# Patient Record
Sex: Male | Born: 1965 | Race: White | Hispanic: No | Marital: Single | Smoking: Former smoker
Health system: Southern US, Community
[De-identification: ages and names within clinical notes are randomized; demographics above are authoritative.]

## PROBLEM LIST (undated history)

## (undated) DIAGNOSIS — N189 Chronic kidney disease, unspecified: Secondary | ICD-10-CM

## (undated) DIAGNOSIS — T7840XA Allergy, unspecified, initial encounter: Secondary | ICD-10-CM

## (undated) DIAGNOSIS — J939 Pneumothorax, unspecified: Secondary | ICD-10-CM

## (undated) DIAGNOSIS — C189 Malignant neoplasm of colon, unspecified: Secondary | ICD-10-CM

## (undated) DIAGNOSIS — B019 Varicella without complication: Secondary | ICD-10-CM

## (undated) DIAGNOSIS — B1910 Unspecified viral hepatitis B without hepatic coma: Secondary | ICD-10-CM

## (undated) DIAGNOSIS — Z800 Family history of malignant neoplasm of digestive organs: Secondary | ICD-10-CM

## (undated) DIAGNOSIS — I10 Essential (primary) hypertension: Secondary | ICD-10-CM

## (undated) DIAGNOSIS — I889 Nonspecific lymphadenitis, unspecified: Secondary | ICD-10-CM

## (undated) DIAGNOSIS — Z8042 Family history of malignant neoplasm of prostate: Secondary | ICD-10-CM

## (undated) DIAGNOSIS — K219 Gastro-esophageal reflux disease without esophagitis: Secondary | ICD-10-CM

## (undated) HISTORY — DX: Allergy, unspecified, initial encounter: T78.40XA

## (undated) HISTORY — DX: Pneumothorax, unspecified: J93.9

## (undated) HISTORY — PX: COLONOSCOPY: SHX174

## (undated) HISTORY — DX: Chronic kidney disease, unspecified: N18.9

## (undated) HISTORY — PX: COLON SURGERY: SHX602

## (undated) HISTORY — DX: Varicella without complication: B01.9

## (undated) HISTORY — DX: Nonspecific lymphadenitis, unspecified: I88.9

## (undated) HISTORY — DX: Family history of malignant neoplasm of digestive organs: Z80.0

## (undated) HISTORY — DX: Gastro-esophageal reflux disease without esophagitis: K21.9

## (undated) HISTORY — DX: Essential (primary) hypertension: I10

## (undated) HISTORY — DX: Family history of malignant neoplasm of prostate: Z80.42

## (undated) HISTORY — PX: PARTIAL COLECTOMY: SHX5273

## (undated) HISTORY — DX: Malignant neoplasm of colon, unspecified: C18.9

## (undated) HISTORY — DX: Unspecified viral hepatitis B without hepatic coma: B19.10

## (undated) HISTORY — PX: POLYPECTOMY: SHX149

---

## 2001-06-29 ENCOUNTER — Ambulatory Visit (HOSPITAL_COMMUNITY): Admission: EM | Admit: 2001-06-29 | Discharge: 2001-06-29 | Payer: Self-pay | Admitting: Emergency Medicine

## 2002-03-30 ENCOUNTER — Emergency Department (HOSPITAL_COMMUNITY): Admission: EM | Admit: 2002-03-30 | Discharge: 2002-03-30 | Payer: Self-pay | Admitting: Emergency Medicine

## 2006-09-10 ENCOUNTER — Ambulatory Visit: Payer: Self-pay | Admitting: Family Medicine

## 2006-11-23 HISTORY — PX: COLON SURGERY: SHX602

## 2006-12-03 ENCOUNTER — Ambulatory Visit: Payer: Self-pay | Admitting: Family Medicine

## 2006-12-03 ENCOUNTER — Ambulatory Visit: Payer: Self-pay | Admitting: Internal Medicine

## 2006-12-03 LAB — CONVERTED CEMR LAB
ALT: 23 units/L (ref 0–40)
AST: 20 units/L (ref 0–37)
Alkaline Phosphatase: 49 units/L (ref 39–117)
BUN: 13 mg/dL (ref 6–23)
Basophils Absolute: 0 10*3/uL (ref 0.0–0.1)
Basophils Relative: 0.8 % (ref 0.0–1.0)
Cholesterol: 161 mg/dL (ref 0–200)
GFR calc non Af Amer: 71 mL/min
Glomerular Filtration Rate, Af Am: 86 mL/min/{1.73_m2}
Glucose, Bld: 93 mg/dL (ref 70–99)
LDL Cholesterol: 111 mg/dL — ABNORMAL HIGH (ref 0–99)
MCHC: 33 g/dL (ref 30.0–36.0)
MCV: 89.2 fL (ref 78.0–100.0)
Platelets: 231 10*3/uL (ref 150–400)
Potassium: 4.1 meq/L (ref 3.5–5.1)
Sodium: 143 meq/L (ref 135–145)
VLDL: 15 mg/dL (ref 0–40)

## 2006-12-10 ENCOUNTER — Ambulatory Visit: Payer: Self-pay | Admitting: Family Medicine

## 2006-12-28 ENCOUNTER — Ambulatory Visit: Payer: Self-pay | Admitting: Gastroenterology

## 2007-01-11 ENCOUNTER — Encounter (INDEPENDENT_AMBULATORY_CARE_PROVIDER_SITE_OTHER): Payer: Self-pay | Admitting: Specialist

## 2007-01-11 ENCOUNTER — Encounter: Payer: Self-pay | Admitting: Family Medicine

## 2007-01-11 ENCOUNTER — Ambulatory Visit: Payer: Self-pay | Admitting: Gastroenterology

## 2007-01-18 ENCOUNTER — Ambulatory Visit: Payer: Self-pay | Admitting: Internal Medicine

## 2007-02-17 ENCOUNTER — Encounter (INDEPENDENT_AMBULATORY_CARE_PROVIDER_SITE_OTHER): Payer: Self-pay | Admitting: Specialist

## 2007-02-17 ENCOUNTER — Inpatient Hospital Stay (HOSPITAL_COMMUNITY): Admission: RE | Admit: 2007-02-17 | Discharge: 2007-02-23 | Payer: Self-pay | Admitting: General Surgery

## 2007-03-04 ENCOUNTER — Ambulatory Visit: Payer: Self-pay | Admitting: Oncology

## 2007-03-22 LAB — CBC WITH DIFFERENTIAL/PLATELET
HCT: 33 % — ABNORMAL LOW (ref 38.7–49.9)
HGB: 11.4 g/dL — ABNORMAL LOW (ref 13.0–17.1)
LYMPH%: 36.1 % (ref 14.0–48.0)
MCH: 28.5 pg (ref 28.0–33.4)
MONO%: 7.9 % (ref 0.0–13.0)
NEUT#: 2.9 10*3/uL (ref 1.5–6.5)
RBC: 4.02 10*6/uL — ABNORMAL LOW (ref 4.20–5.71)
RDW: 13.8 % (ref 11.2–14.6)

## 2007-03-22 LAB — COMPREHENSIVE METABOLIC PANEL
ALT: 18 U/L (ref 0–53)
Alkaline Phosphatase: 55 U/L (ref 39–117)
CO2: 26 mEq/L (ref 19–32)
Chloride: 101 mEq/L (ref 96–112)
Creatinine, Ser: 1.14 mg/dL (ref 0.40–1.50)
Glucose, Bld: 76 mg/dL (ref 70–99)
Potassium: 4.2 mEq/L (ref 3.5–5.3)
Sodium: 137 mEq/L (ref 135–145)
Total Protein: 7.1 g/dL (ref 6.0–8.3)

## 2007-03-24 LAB — PSA: PSA: 1.91 ng/mL (ref 0.10–4.00)

## 2007-03-28 ENCOUNTER — Ambulatory Visit (HOSPITAL_COMMUNITY): Admission: RE | Admit: 2007-03-28 | Discharge: 2007-03-28 | Payer: Self-pay | Admitting: General Surgery

## 2007-04-01 ENCOUNTER — Ambulatory Visit (HOSPITAL_COMMUNITY): Admission: RE | Admit: 2007-04-01 | Discharge: 2007-04-01 | Payer: Self-pay | Admitting: Oncology

## 2007-04-04 ENCOUNTER — Ambulatory Visit: Admission: RE | Admit: 2007-04-04 | Discharge: 2007-06-08 | Payer: Self-pay | Admitting: Radiation Oncology

## 2007-04-15 ENCOUNTER — Ambulatory Visit: Payer: Self-pay | Admitting: Oncology

## 2007-04-20 LAB — CBC WITH DIFFERENTIAL/PLATELET
EOS%: 6.4 % (ref 0.0–7.0)
Eosinophils Absolute: 0.3 10*3/uL (ref 0.0–0.5)
MCV: 80.7 fL — ABNORMAL LOW (ref 81.6–98.0)
MONO%: 7.7 % (ref 0.0–13.0)
NEUT#: 3.1 10*3/uL (ref 1.5–6.5)
RBC: 4.22 10*6/uL (ref 4.20–5.71)
RDW: 14 % (ref 11.2–14.6)
lymph#: 1.2 10*3/uL (ref 0.9–3.3)

## 2007-04-25 LAB — CBC WITH DIFFERENTIAL/PLATELET
Eosinophils Absolute: 0.3 10*3/uL (ref 0.0–0.5)
MONO#: 0.5 10*3/uL (ref 0.1–0.9)
NEUT#: 3.1 10*3/uL (ref 1.5–6.5)
Platelets: 225 10*3/uL (ref 145–400)
RBC: 4.55 10*6/uL (ref 4.20–5.71)
RDW: 13.9 % (ref 11.2–14.6)
WBC: 4.9 10*3/uL (ref 4.0–10.0)

## 2007-05-02 LAB — CBC WITH DIFFERENTIAL/PLATELET
Eosinophils Absolute: 0.3 10*3/uL (ref 0.0–0.5)
HCT: 32.8 % — ABNORMAL LOW (ref 38.7–49.9)
HGB: 11.5 g/dL — ABNORMAL LOW (ref 13.0–17.1)
LYMPH%: 16.5 % (ref 14.0–48.0)
MONO#: 0.4 10*3/uL (ref 0.1–0.9)
NEUT#: 2.4 10*3/uL (ref 1.5–6.5)
NEUT%: 65.5 % (ref 40.0–75.0)
Platelets: 180 10*3/uL (ref 145–400)
WBC: 3.7 10*3/uL — ABNORMAL LOW (ref 4.0–10.0)

## 2007-05-09 LAB — CBC WITH DIFFERENTIAL/PLATELET
BASO%: 0.3 % (ref 0.0–2.0)
HCT: 36.7 % — ABNORMAL LOW (ref 38.7–49.9)
LYMPH%: 10.3 % — ABNORMAL LOW (ref 14.0–48.0)
MCH: 28.2 pg (ref 28.0–33.4)
MCHC: 34.5 g/dL (ref 32.0–35.9)
MONO#: 0.5 10*3/uL (ref 0.1–0.9)
NEUT%: 69.9 % (ref 40.0–75.0)
Platelets: 239 10*3/uL (ref 145–400)
WBC: 3.8 10*3/uL — ABNORMAL LOW (ref 4.0–10.0)

## 2007-05-12 LAB — COMPREHENSIVE METABOLIC PANEL
ALT: 12 U/L (ref 0–53)
BUN: 14 mg/dL (ref 6–23)
CO2: 26 mEq/L (ref 19–32)
Creatinine, Ser: 1.34 mg/dL (ref 0.40–1.50)
Total Bilirubin: 0.3 mg/dL (ref 0.3–1.2)

## 2007-05-16 LAB — URINALYSIS, MICROSCOPIC - CHCC
Glucose: NEGATIVE g/dL
Leukocyte Esterase: NEGATIVE
Nitrite: NEGATIVE

## 2007-05-16 LAB — BASIC METABOLIC PANEL
BUN: 9 mg/dL (ref 6–23)
Chloride: 107 mEq/L (ref 96–112)
Creatinine, Ser: 1.13 mg/dL (ref 0.40–1.50)

## 2007-05-16 LAB — CBC WITH DIFFERENTIAL/PLATELET
BASO%: 1.9 % (ref 0.0–2.0)
EOS%: 6.3 % (ref 0.0–7.0)
LYMPH%: 14.1 % (ref 14.0–48.0)
MCHC: 34.1 g/dL (ref 32.0–35.9)
MCV: 80.8 fL — ABNORMAL LOW (ref 81.6–98.0)
MONO%: 17.9 % — ABNORMAL HIGH (ref 0.0–13.0)
Platelets: 305 10*3/uL (ref 145–400)
RBC: 4.68 10*6/uL (ref 4.20–5.71)

## 2007-05-18 LAB — URINALYSIS, MICROSCOPIC - CHCC
Glucose: NEGATIVE g/dL
Leukocyte Esterase: NEGATIVE
Nitrite: NEGATIVE

## 2007-05-26 LAB — CBC WITH DIFFERENTIAL/PLATELET
EOS%: 8.5 % — ABNORMAL HIGH (ref 0.0–7.0)
LYMPH%: 8.4 % — ABNORMAL LOW (ref 14.0–48.0)
MCH: 28.2 pg (ref 28.0–33.4)
MCHC: 34.5 g/dL (ref 32.0–35.9)
MCV: 81.7 fL (ref 81.6–98.0)
MONO%: 10.1 % (ref 0.0–13.0)
Platelets: 271 10*3/uL (ref 145–400)
RBC: 4.27 10*6/uL (ref 4.20–5.71)
RDW: 19.7 % — ABNORMAL HIGH (ref 11.2–14.6)

## 2007-06-01 ENCOUNTER — Ambulatory Visit: Payer: Self-pay | Admitting: Oncology

## 2007-06-13 LAB — CBC WITH DIFFERENTIAL/PLATELET
BASO%: 0.3 % (ref 0.0–2.0)
Eosinophils Absolute: 0.4 10*3/uL (ref 0.0–0.5)
LYMPH%: 11.1 % — ABNORMAL LOW (ref 14.0–48.0)
MCHC: 35.1 g/dL (ref 32.0–35.9)
MONO#: 0.5 10*3/uL (ref 0.1–0.9)
MONO%: 9.5 % (ref 0.0–13.0)
NEUT#: 3.4 10*3/uL (ref 1.5–6.5)
RBC: 4.47 10*6/uL (ref 4.20–5.71)
RDW: 20.4 % — ABNORMAL HIGH (ref 11.2–14.6)
WBC: 4.8 10*3/uL (ref 4.0–10.0)

## 2007-06-13 LAB — COMPREHENSIVE METABOLIC PANEL
ALT: 15 U/L (ref 0–53)
Albumin: 3.8 g/dL (ref 3.5–5.2)
Alkaline Phosphatase: 49 U/L (ref 39–117)
Glucose, Bld: 79 mg/dL (ref 70–99)
Potassium: 4.2 mEq/L (ref 3.5–5.3)
Sodium: 140 mEq/L (ref 135–145)
Total Bilirubin: 0.3 mg/dL (ref 0.3–1.2)
Total Protein: 6.7 g/dL (ref 6.0–8.3)

## 2007-07-08 LAB — COMPREHENSIVE METABOLIC PANEL
Alkaline Phosphatase: 51 U/L (ref 39–117)
BUN: 14 mg/dL (ref 6–23)
CO2: 31 mEq/L (ref 19–32)
Creatinine, Ser: 0.95 mg/dL (ref 0.40–1.50)
Glucose, Bld: 112 mg/dL — ABNORMAL HIGH (ref 70–99)
Sodium: 139 mEq/L (ref 135–145)
Total Bilirubin: 0.5 mg/dL (ref 0.3–1.2)

## 2007-07-08 LAB — CBC WITH DIFFERENTIAL/PLATELET
Basophils Absolute: 0 10*3/uL (ref 0.0–0.1)
Eosinophils Absolute: 0.2 10*3/uL (ref 0.0–0.5)
HCT: 36 % — ABNORMAL LOW (ref 38.7–49.9)
HGB: 12.8 g/dL — ABNORMAL LOW (ref 13.0–17.1)
LYMPH%: 19.8 % (ref 14.0–48.0)
MCV: 82.5 fL (ref 81.6–98.0)
MONO#: 0.5 10*3/uL (ref 0.1–0.9)
MONO%: 17.6 % — ABNORMAL HIGH (ref 0.0–13.0)
NEUT#: 1.5 10*3/uL (ref 1.5–6.5)
NEUT%: 53.3 % (ref 40.0–75.0)
Platelets: 195 10*3/uL (ref 145–400)
RBC: 4.37 10*6/uL (ref 4.20–5.71)
WBC: 2.8 10*3/uL — ABNORMAL LOW (ref 4.0–10.0)

## 2007-07-20 ENCOUNTER — Ambulatory Visit: Payer: Self-pay | Admitting: Oncology

## 2007-07-29 LAB — CBC WITH DIFFERENTIAL/PLATELET
BASO%: 1 % (ref 0.0–2.0)
EOS%: 7.7 % — ABNORMAL HIGH (ref 0.0–7.0)
LYMPH%: 19.4 % (ref 14.0–48.0)
MCH: 30.2 pg (ref 28.0–33.4)
MCHC: 35.8 g/dL (ref 32.0–35.9)
MONO#: 0.6 10*3/uL (ref 0.1–0.9)
Platelets: 209 10*3/uL (ref 145–400)
RBC: 4.28 10*6/uL (ref 4.20–5.71)
WBC: 3.1 10*3/uL — ABNORMAL LOW (ref 4.0–10.0)

## 2007-07-29 LAB — COMPREHENSIVE METABOLIC PANEL
ALT: 35 U/L (ref 0–53)
AST: 26 U/L (ref 0–37)
Alkaline Phosphatase: 55 U/L (ref 39–117)
CO2: 27 mEq/L (ref 19–32)
Creatinine, Ser: 1.11 mg/dL (ref 0.40–1.50)
Sodium: 140 mEq/L (ref 135–145)
Total Bilirubin: 0.9 mg/dL (ref 0.3–1.2)
Total Protein: 6.4 g/dL (ref 6.0–8.3)

## 2007-08-12 LAB — CBC WITH DIFFERENTIAL/PLATELET
BASO%: 0.7 % (ref 0.0–2.0)
EOS%: 12.2 % — ABNORMAL HIGH (ref 0.0–7.0)
HCT: 35.2 % — ABNORMAL LOW (ref 38.7–49.9)
LYMPH%: 18.6 % (ref 14.0–48.0)
MCH: 30.9 pg (ref 28.0–33.4)
MCHC: 35.7 g/dL (ref 32.0–35.9)
MCV: 86.7 fL (ref 81.6–98.0)
MONO%: 17.4 % — ABNORMAL HIGH (ref 0.0–13.0)
NEUT%: 51.1 % (ref 40.0–75.0)
Platelets: 189 10*3/uL (ref 145–400)
RBC: 4.06 10*6/uL — ABNORMAL LOW (ref 4.20–5.71)
WBC: 2.8 10*3/uL — ABNORMAL LOW (ref 4.0–10.0)

## 2007-08-12 LAB — COMPREHENSIVE METABOLIC PANEL
ALT: 35 U/L (ref 0–53)
Alkaline Phosphatase: 52 U/L (ref 39–117)
CO2: 31 mEq/L (ref 19–32)
Creatinine, Ser: 1.09 mg/dL (ref 0.40–1.50)
Sodium: 140 mEq/L (ref 135–145)
Total Bilirubin: 0.8 mg/dL (ref 0.3–1.2)
Total Protein: 6.7 g/dL (ref 6.0–8.3)

## 2007-08-26 LAB — CBC WITH DIFFERENTIAL/PLATELET
Eosinophils Absolute: 0.3 10*3/uL (ref 0.0–0.5)
HCT: 35.4 % — ABNORMAL LOW (ref 38.7–49.9)
LYMPH%: 13.8 % — ABNORMAL LOW (ref 14.0–48.0)
MCHC: 36.8 g/dL — ABNORMAL HIGH (ref 32.0–35.9)
MCV: 86.4 fL (ref 81.6–98.0)
MONO#: 0.8 10*3/uL (ref 0.1–0.9)
MONO%: 16.6 % — ABNORMAL HIGH (ref 0.0–13.0)
NEUT#: 2.7 10*3/uL (ref 1.5–6.5)
NEUT%: 60.1 % (ref 40.0–75.0)
Platelets: 228 10*3/uL (ref 145–400)
RBC: 4.1 10*6/uL — ABNORMAL LOW (ref 4.20–5.71)
WBC: 4.5 10*3/uL (ref 4.0–10.0)

## 2007-08-26 LAB — COMPREHENSIVE METABOLIC PANEL
AST: 35 U/L (ref 0–37)
Alkaline Phosphatase: 57 U/L (ref 39–117)
BUN: 15 mg/dL (ref 6–23)
Creatinine, Ser: 1.05 mg/dL (ref 0.40–1.50)
Glucose, Bld: 116 mg/dL — ABNORMAL HIGH (ref 70–99)
Potassium: 4.1 mEq/L (ref 3.5–5.3)
Total Bilirubin: 0.5 mg/dL (ref 0.3–1.2)

## 2007-09-07 ENCOUNTER — Ambulatory Visit: Payer: Self-pay | Admitting: Oncology

## 2007-09-09 LAB — COMPREHENSIVE METABOLIC PANEL
AST: 30 U/L (ref 0–37)
Albumin: 4 g/dL (ref 3.5–5.2)
Alkaline Phosphatase: 53 U/L (ref 39–117)
BUN: 16 mg/dL (ref 6–23)
Calcium: 8.9 mg/dL (ref 8.4–10.5)
Creatinine, Ser: 1.16 mg/dL (ref 0.40–1.50)
Glucose, Bld: 89 mg/dL (ref 70–99)

## 2007-09-09 LAB — CBC WITH DIFFERENTIAL/PLATELET
Basophils Absolute: 0 10*3/uL (ref 0.0–0.1)
EOS%: 10 % — ABNORMAL HIGH (ref 0.0–7.0)
Eosinophils Absolute: 0.4 10*3/uL (ref 0.0–0.5)
HCT: 34.9 % — ABNORMAL LOW (ref 38.7–49.9)
HGB: 12.6 g/dL — ABNORMAL LOW (ref 13.0–17.1)
MCH: 31.8 pg (ref 28.0–33.4)
MCV: 88.6 fL (ref 81.6–98.0)
MONO%: 16.1 % — ABNORMAL HIGH (ref 0.0–13.0)
NEUT#: 2 10*3/uL (ref 1.5–6.5)
NEUT%: 56.5 % (ref 40.0–75.0)
Platelets: 201 10*3/uL (ref 145–400)

## 2007-09-23 LAB — CBC WITH DIFFERENTIAL/PLATELET
Basophils Absolute: 0.1 10*3/uL (ref 0.0–0.1)
EOS%: 6.5 % (ref 0.0–7.0)
Eosinophils Absolute: 0.3 10*3/uL (ref 0.0–0.5)
HCT: 38.5 % — ABNORMAL LOW (ref 38.7–49.9)
HGB: 13.7 g/dL (ref 13.0–17.1)
MCH: 31.3 pg (ref 28.0–33.4)
MCV: 88.1 fL (ref 81.6–98.0)
NEUT#: 2.3 10*3/uL (ref 1.5–6.5)
NEUT%: 58 % (ref 40.0–75.0)
RDW: 13.1 % (ref 11.2–14.6)
lymph#: 0.5 10*3/uL — ABNORMAL LOW (ref 0.9–3.3)

## 2007-09-23 LAB — COMPREHENSIVE METABOLIC PANEL
Albumin: 3.9 g/dL (ref 3.5–5.2)
BUN: 14 mg/dL (ref 6–23)
CO2: 23 mEq/L (ref 19–32)
Calcium: 8.9 mg/dL (ref 8.4–10.5)
Chloride: 107 mEq/L (ref 96–112)
Creatinine, Ser: 1 mg/dL (ref 0.40–1.50)
Glucose, Bld: 126 mg/dL — ABNORMAL HIGH (ref 70–99)
Potassium: 3.6 mEq/L (ref 3.5–5.3)

## 2007-10-07 LAB — CBC WITH DIFFERENTIAL/PLATELET
Basophils Absolute: 0.1 10*3/uL (ref 0.0–0.1)
EOS%: 8.8 % — ABNORMAL HIGH (ref 0.0–7.0)
Eosinophils Absolute: 0.3 10*3/uL (ref 0.0–0.5)
HCT: UNDETERMINED % (ref 38.7–49.9)
HGB: 13.7 g/dL (ref 13.0–17.1)
MCH: UNDETERMINED pg (ref 28.0–33.4)
MONO#: 0.6 10*3/uL (ref 0.1–0.9)
NEUT#: 2 10*3/uL (ref 1.5–6.5)
NEUT%: 53.8 % (ref 40.0–75.0)
RDW: UNDETERMINED % (ref 11.2–14.6)
WBC: 3.7 10*3/uL — ABNORMAL LOW (ref 4.0–10.0)
lymph#: 0.7 10*3/uL — ABNORMAL LOW (ref 0.9–3.3)

## 2007-10-07 LAB — COMPREHENSIVE METABOLIC PANEL
AST: 46 U/L — ABNORMAL HIGH (ref 0–37)
Albumin: 3.5 g/dL (ref 3.5–5.2)
BUN: 13 mg/dL (ref 6–23)
CO2: 28 mEq/L (ref 19–32)
Calcium: 9 mg/dL (ref 8.4–10.5)
Chloride: 106 mEq/L (ref 96–112)
Creatinine, Ser: 1.14 mg/dL (ref 0.40–1.50)
Glucose, Bld: 107 mg/dL — ABNORMAL HIGH (ref 70–99)
Potassium: 3.9 mEq/L (ref 3.5–5.3)

## 2007-10-17 ENCOUNTER — Ambulatory Visit: Payer: Self-pay | Admitting: Oncology

## 2007-10-21 LAB — CBC WITH DIFFERENTIAL/PLATELET
BASO%: 1.2 % (ref 0.0–2.0)
Basophils Absolute: 0 10*3/uL (ref 0.0–0.1)
EOS%: 10.9 % — ABNORMAL HIGH (ref 0.0–7.0)
HCT: 39.1 % (ref 38.7–49.9)
HGB: 13.9 g/dL (ref 13.0–17.1)
MCH: 32.3 pg (ref 28.0–33.4)
MONO#: 0.6 10*3/uL (ref 0.1–0.9)
NEUT%: 56.2 % (ref 40.0–75.0)
RDW: 15.4 % — ABNORMAL HIGH (ref 11.2–14.6)
WBC: 3.9 10*3/uL — ABNORMAL LOW (ref 4.0–10.0)
lymph#: 0.7 10*3/uL — ABNORMAL LOW (ref 0.9–3.3)

## 2007-10-21 LAB — COMPREHENSIVE METABOLIC PANEL
ALT: 59 U/L — ABNORMAL HIGH (ref 0–53)
AST: 38 U/L — ABNORMAL HIGH (ref 0–37)
Albumin: 3.6 g/dL (ref 3.5–5.2)
BUN: 15 mg/dL (ref 6–23)
CO2: 27 mEq/L (ref 19–32)
Calcium: 9.2 mg/dL (ref 8.4–10.5)
Chloride: 107 mEq/L (ref 96–112)
Potassium: 3.7 mEq/L (ref 3.5–5.3)

## 2007-10-21 LAB — CEA: CEA: 1.8 ng/mL (ref 0.0–5.0)

## 2007-10-26 ENCOUNTER — Ambulatory Visit (HOSPITAL_COMMUNITY): Admission: RE | Admit: 2007-10-26 | Discharge: 2007-10-26 | Payer: Self-pay | Admitting: Oncology

## 2007-11-11 LAB — CBC WITH DIFFERENTIAL/PLATELET
BASO%: 2.2 % — ABNORMAL HIGH (ref 0.0–2.0)
EOS%: 7.6 % — ABNORMAL HIGH (ref 0.0–7.0)
MCH: 32.4 pg (ref 28.0–33.4)
MCV: 92 fL (ref 81.6–98.0)
MONO%: 12.5 % (ref 0.0–13.0)
RBC: 4.23 10*6/uL (ref 4.20–5.71)
RDW: 12.1 % (ref 11.2–14.6)
lymph#: 0.6 10*3/uL — ABNORMAL LOW (ref 0.9–3.3)

## 2007-11-25 ENCOUNTER — Ambulatory Visit: Payer: Self-pay | Admitting: Oncology

## 2007-11-25 LAB — CBC WITH DIFFERENTIAL/PLATELET
BASO%: 2.1 % — ABNORMAL HIGH (ref 0.0–2.0)
Eosinophils Absolute: 0.2 10*3/uL (ref 0.0–0.5)
MCHC: 36.4 g/dL — ABNORMAL HIGH (ref 32.0–35.9)
MONO#: 0.6 10*3/uL (ref 0.1–0.9)
NEUT#: 2.3 10*3/uL (ref 1.5–6.5)
RBC: 4.4 10*6/uL (ref 4.20–5.71)
RDW: 11.3 % (ref 11.2–14.6)
WBC: 3.9 10*3/uL — ABNORMAL LOW (ref 4.0–10.0)

## 2007-12-02 LAB — COMPREHENSIVE METABOLIC PANEL
ALT: 29 U/L (ref 0–53)
CO2: 24 mEq/L (ref 19–32)
Calcium: 9 mg/dL (ref 8.4–10.5)
Chloride: 107 mEq/L (ref 96–112)
Glucose, Bld: 117 mg/dL — ABNORMAL HIGH (ref 70–99)
Sodium: 142 mEq/L (ref 135–145)
Total Bilirubin: 0.4 mg/dL (ref 0.3–1.2)
Total Protein: 7 g/dL (ref 6.0–8.3)

## 2007-12-02 LAB — CBC WITH DIFFERENTIAL/PLATELET
BASO%: 2.2 % — ABNORMAL HIGH (ref 0.0–2.0)
Eosinophils Absolute: 0.2 10*3/uL (ref 0.0–0.5)
HCT: UNDETERMINED % (ref 38.7–49.9)
LYMPH%: 18.1 % (ref 14.0–48.0)
MONO#: 0.5 10*3/uL (ref 0.1–0.9)
NEUT#: 2.4 10*3/uL (ref 1.5–6.5)
NEUT%: 61.1 % (ref 40.0–75.0)
Platelets: 209 10*3/uL (ref 145–400)
RBC: UNDETERMINED 10*6/uL (ref 4.20–5.71)
WBC: 4 10*3/uL (ref 4.0–10.0)
lymph#: 0.7 10*3/uL — ABNORMAL LOW (ref 0.9–3.3)

## 2007-12-16 LAB — COMPREHENSIVE METABOLIC PANEL
ALT: 23 U/L (ref 0–53)
Albumin: 3.7 g/dL (ref 3.5–5.2)
CO2: 31 mEq/L (ref 19–32)
Chloride: 102 mEq/L (ref 96–112)
Glucose, Bld: 105 mg/dL — ABNORMAL HIGH (ref 70–99)
Potassium: 4.4 mEq/L (ref 3.5–5.3)
Sodium: 140 mEq/L (ref 135–145)
Total Bilirubin: 0.7 mg/dL (ref 0.3–1.2)
Total Protein: 7 g/dL (ref 6.0–8.3)

## 2007-12-16 LAB — CBC WITH DIFFERENTIAL/PLATELET
BASO%: 1 % (ref 0.0–2.0)
Eosinophils Absolute: 0.2 10*3/uL (ref 0.0–0.5)
LYMPH%: 21.6 % (ref 14.0–48.0)
MCHC: 34.3 g/dL (ref 32.0–35.9)
MONO#: 0.5 10*3/uL (ref 0.1–0.9)
NEUT#: 1.8 10*3/uL (ref 1.5–6.5)
Platelets: 251 10*3/uL (ref 145–400)
RBC: 4.42 10*6/uL (ref 4.20–5.71)
RDW: 14 % (ref 11.2–14.6)
WBC: 3.2 10*3/uL — ABNORMAL LOW (ref 4.0–10.0)
lymph#: 0.7 10*3/uL — ABNORMAL LOW (ref 0.9–3.3)

## 2008-01-19 ENCOUNTER — Ambulatory Visit: Payer: Self-pay | Admitting: Oncology

## 2008-01-22 IMAGING — PT NM PET TUM IMG SKULL BASE T - THIGH
6 series · 25 of 25 positions shown · non-contrast
Comparison: 01/18/2007

CLINICAL DATA: Colon cancer

FDG PET-CT TUMOR IMAGING (SKULL BASE TO THIGHS):
Fasting Blood Glucose:  109
TECHNIQUE: 16.3 mCi F-18 FDG was injected intravenously via the right
antecubital fossa .  Full-ring PET imaging was performed from the skull base
through the mid-thighs 73 minutes after injection.  CT data was obtained and
used for attenuation correction and anatomic localization only.  (This was not
acquired as a diagnostic CT examination.)

[Series 1: pet ac · axial · 3.3mm · 4.69mm/px · z∈[-870,+0]mm · 5 of 267 slices shown]
[im 1/267]
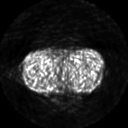
[im 67/267]
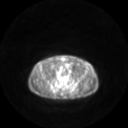
[im 134/267]
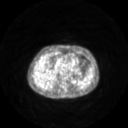
[im 200/267]
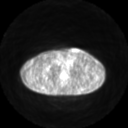
[im 267/267]
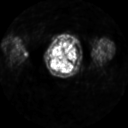

[Series 2: ct images · axial · 3.8mm · 0.98mm/px · z∈[-870,+0]mm · 6 of 267 slices shown]
[im 1/267]
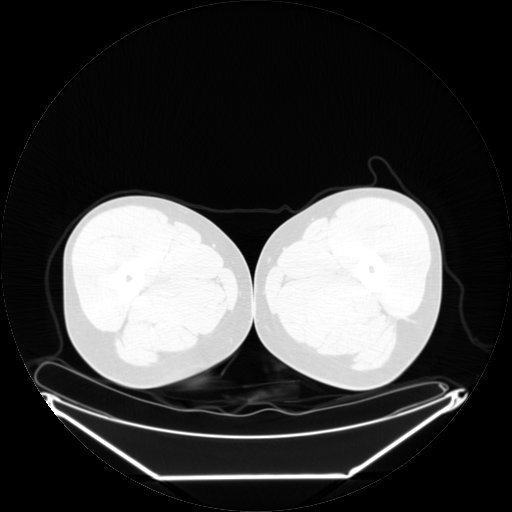
[im 54/267]
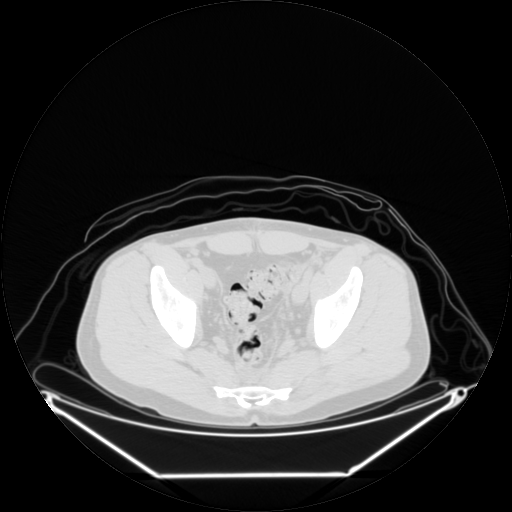
[im 107/267]
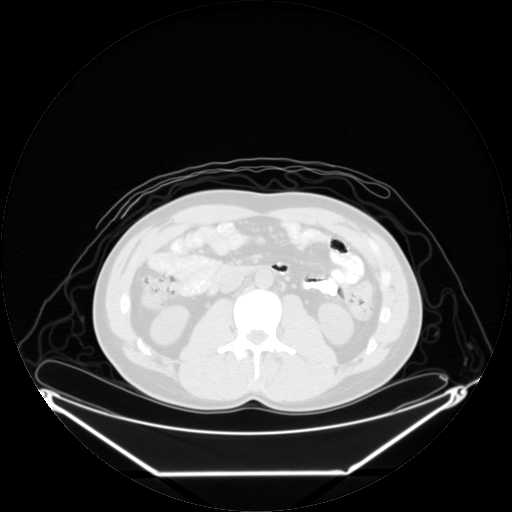
[im 160/267]
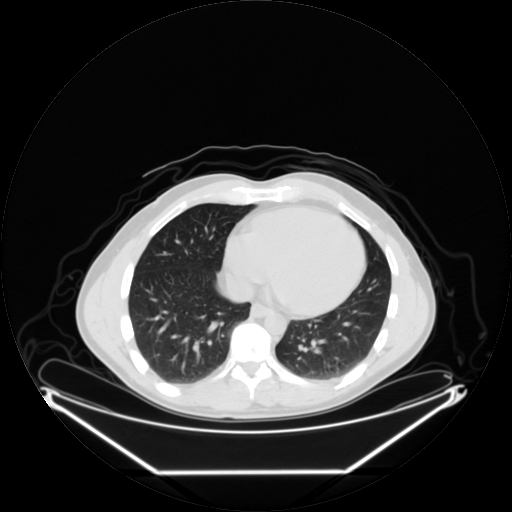
[im 213/267]
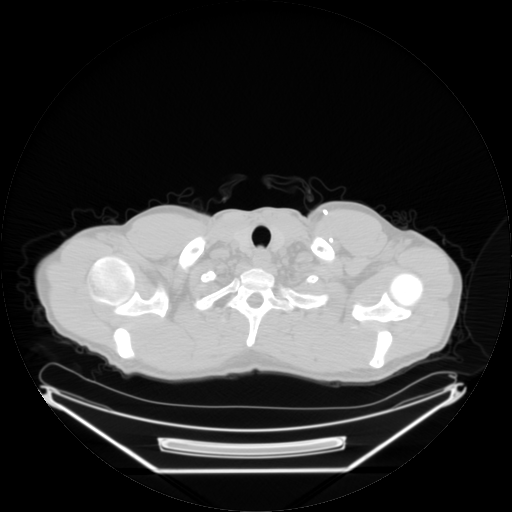
[im 267/267  brain]
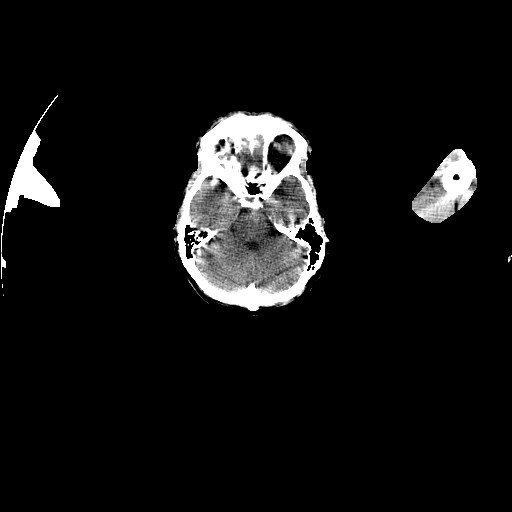

[Series 2: pet nac · axial · 3.3mm · 4.69mm/px · z∈[-870,+0]mm · 6 of 267 slices shown]
[im 1/267]
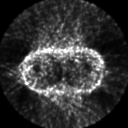
[im 54/267]
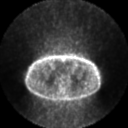
[im 107/267]
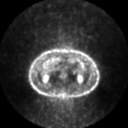
[im 160/267]
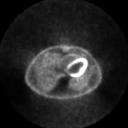
[im 213/267]
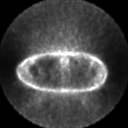
[im 267/267]
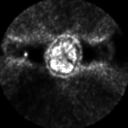

[Series 123: mip · coronal · 3.3mm · 4.69mm/px · 1 of 30 slices shown]
[im 1/30]
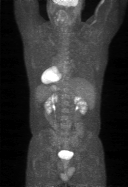

[Series 151: reformatted · axial · 3.3mm · 3.91mm/px · z∈[-857,-13]mm · 6 of 257 slices shown (1 of 2)]
[im 1/257]
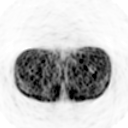
[im 52/257]
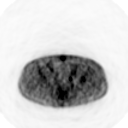
[im 103/257]
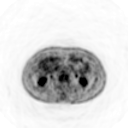
[im 154/257]
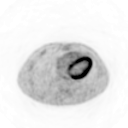
[im 205/257]
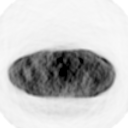
[im 257/257]
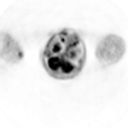

[Series 153: reformatted · coronal · 4.7mm · 6.98mm/px · 1 of 67 slices shown (2 of 2)]
[im 1/67]
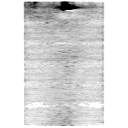

[25 of 25 positions shown; findings below may reference images not displayed]

FINDINGS: No hypermetabolic lymph nodes within the supraclavicular, axillary,
or mediastinal or hilar region.

Negative for pericardial or pleural effusion.

No hyper metabolic pulmonary nodule or masses noted.

Gastrohepatic ligament lymph node measures 1.1 x 1.7 cm, image 132. The SUV max
within this lymph node is equal to 2.1. 

Adrenal glands are negative. 

Liver is negative.

There are postoperative changes within the presacral soft tissues with
associated low level FDG uptake. Attention to this area on followup examination
is recommended.

Postoperative changes are seen within the lower anterior abdominal wall. 

There is asymmetric increased radiotracer uptake within the left iliac fossa
corresponding to asymmetric soft tissue on CT images. For  reference purposes
this has an SUV max equal to 2.2, image 210.
IMPRESSION: 1. Again seen is enlarged gastrohepatic ligament lymph node. There is mild
increased FDG uptake within this lymph node which is equivocal for metastasis.

2. Increased radiotracer uptake within the left hemipelvis corresponding to
nonspecific, amorphous soft tissue and CT images. This is suspicious for
regional lymph node metastasis.

## 2008-01-24 ENCOUNTER — Encounter: Payer: Self-pay | Admitting: Gastroenterology

## 2008-02-06 ENCOUNTER — Encounter: Admission: RE | Admit: 2008-02-06 | Discharge: 2008-02-06 | Payer: Self-pay | Admitting: General Surgery

## 2008-03-06 ENCOUNTER — Ambulatory Visit: Payer: Self-pay | Admitting: Oncology

## 2008-04-23 ENCOUNTER — Ambulatory Visit: Payer: Self-pay | Admitting: Oncology

## 2008-04-25 ENCOUNTER — Ambulatory Visit (HOSPITAL_COMMUNITY): Admission: RE | Admit: 2008-04-25 | Discharge: 2008-04-25 | Payer: Self-pay | Admitting: Oncology

## 2008-04-26 LAB — CBC WITH DIFFERENTIAL/PLATELET
HCT: 39.3 % (ref 38.7–49.9)
LYMPH%: 16.6 % (ref 14.0–48.0)
MCH: 29.7 pg (ref 28.0–33.4)
MONO#: 0.4 10*3/uL (ref 0.1–0.9)
MONO%: 7.8 % (ref 0.0–13.0)
NEUT#: 3.1 10*3/uL (ref 1.5–6.5)
NEUT%: 66.9 % (ref 40.0–75.0)
Platelets: 216 10*3/uL (ref 145–400)
RBC: 4.57 10*6/uL (ref 4.20–5.71)
RDW: 12.9 % (ref 11.2–14.6)

## 2008-04-26 LAB — CEA: CEA: 1 ng/mL (ref 0.0–5.0)

## 2008-04-26 LAB — COMPREHENSIVE METABOLIC PANEL
ALT: 15 U/L (ref 0–53)
Albumin: 3.4 g/dL — ABNORMAL LOW (ref 3.5–5.2)
Alkaline Phosphatase: 53 U/L (ref 39–117)
CO2: 29 mEq/L (ref 19–32)
Total Bilirubin: 0.7 mg/dL (ref 0.3–1.2)
Total Protein: 6.6 g/dL (ref 6.0–8.3)

## 2008-05-04 ENCOUNTER — Telehealth: Payer: Self-pay | Admitting: Gastroenterology

## 2008-05-31 DIAGNOSIS — L255 Unspecified contact dermatitis due to plants, except food: Secondary | ICD-10-CM | POA: Insufficient documentation

## 2008-06-01 ENCOUNTER — Ambulatory Visit: Payer: Self-pay | Admitting: Family Medicine

## 2008-06-01 ENCOUNTER — Telehealth: Payer: Self-pay | Admitting: *Deleted

## 2008-10-23 ENCOUNTER — Ambulatory Visit: Payer: Self-pay | Admitting: Oncology

## 2008-10-25 ENCOUNTER — Telehealth: Payer: Self-pay | Admitting: Gastroenterology

## 2008-10-25 ENCOUNTER — Encounter: Payer: Self-pay | Admitting: Gastroenterology

## 2008-12-24 ENCOUNTER — Ambulatory Visit: Payer: Self-pay | Admitting: Oncology

## 2008-12-26 ENCOUNTER — Ambulatory Visit (HOSPITAL_COMMUNITY): Admission: RE | Admit: 2008-12-26 | Discharge: 2008-12-26 | Payer: Self-pay | Admitting: Oncology

## 2009-01-03 ENCOUNTER — Telehealth: Payer: Self-pay | Admitting: Gastroenterology

## 2009-01-07 ENCOUNTER — Encounter: Payer: Self-pay | Admitting: Gastroenterology

## 2009-01-15 ENCOUNTER — Encounter: Payer: Self-pay | Admitting: Gastroenterology

## 2009-01-31 ENCOUNTER — Ambulatory Visit: Payer: Self-pay | Admitting: Gastroenterology

## 2009-02-13 ENCOUNTER — Ambulatory Visit: Payer: Self-pay | Admitting: Gastroenterology

## 2009-05-10 ENCOUNTER — Ambulatory Visit: Payer: Self-pay | Admitting: Oncology

## 2009-07-11 ENCOUNTER — Ambulatory Visit: Payer: Self-pay | Admitting: Oncology

## 2009-08-13 ENCOUNTER — Ambulatory Visit: Payer: Self-pay | Admitting: Oncology

## 2010-12-14 ENCOUNTER — Encounter: Payer: Self-pay | Admitting: General Surgery

## 2010-12-23 NOTE — Procedures (Signed)
Summary: Colonoscopy Report/[facility]  Colonoscopy Report/[facility]   Imported By: Maryln Gottron 04/23/2010 15:56:52  _____________________________________________________________________  External Attachment:    Type:   Image     Comment:   External Document

## 2011-04-07 NOTE — Op Note (Signed)
NAMECURVIN, HUNGER                ACCOUNT NO.:  [id number]   MEDICAL RECORD NO.:  [id number]          PATIENT TYPE:  AMB   LOCATION:  DAY                          FACILITY:  [facility]   PHYSICIAN:  Sharlet Salina T. Hoxworth, M.D.DATE OF BIRTH:  05-24-1966   DATE OF PROCEDURE:  03/28/2007  DATE OF DISCHARGE:                               OPERATIVE REPORT   PREOPERATIVE DIAGNOSIS:  Cancer of the colon.   POSTOPERATIVE DIAGNOSIS:  Cancer of the colon.   SURGICAL PROCEDURES:  Placement of Bard Power Port subcutaneous venous  port.   SURGEON:  Lorne Skeens. Hoxworth, M.D.   ANESTHESIA:  Local with IV sedation.   BRIEF HISTORY:  [name] is a 45 year old male with a recent diagnosis  of stage III carcinoma colon who will require long-term venous acess for  require long-term venous access for chemotherapy.  Placement of a  subcutaneous venous port has been recommended and accepted.  The nature of the procedure, its indications, risks of bleeding,  infection, pneumothorax, catheter displacement, infection, and  thrombosis been discussed an understood.  He is now brought to operating  room for this procedure.   DESCRIPTION OF PROCEDURE:  The patient brought to the operating room and  placed in supine position on the operating and IV sedation was  administered.  The chest, neck and shoulders were sterilely prepped and  draped.  Correct patient and procedure were verified.  Local anesthesia  was used to infiltrate the infraclavicular area on the left in the port  site.  The left subclavian vein was then easily cannulated with a needle  and guidewire confirmed by fluoroscopy.  Through a small stab incision,  the introducer was placed over the guidewire and the flushed catheter  placed via the introducer which was peeled away and the catheter tip was  positioned in the region of the superior vena cava/atrial junction,  confirmed by fluoroscopy.  A small transverse incision was made on the  anterior chest wall and subcutaneous pocket created.  The catheter was  tunneled subcutaneously to the pocket, trimmed to length and attached to  the port which was then sutured to the anterior chest wall with  interrupted 2-0 Prolene.  Subcu was closed with interrupted 4-0 Monocryl  and the skin with running subcuticular 4-0 Monocryl and Steri-Strips.  The catheter flushed and aspirated easily and was left flushed with  concentrated heparin solution.  Sponge, needle and instrument counts  were correct.  Dry sterile dressing was applied and the patient taken  recovery in good condition.      Lorne Skeens. Hoxworth, M.D.  Electronically Signed     BTH/MEDQ  D:  03/28/2007  T:  03/28/2007  Job:  [id number]

## 2011-04-07 NOTE — Discharge Summary (Signed)
NAMEJOSPH, NORFLEET                ACCOUNT NO.:  [id number]   MEDICAL RECORD NO.:  [id number]          PATIENT TYPE:  INP   LOCATION:  1613                         FACILITY:  [facility]   PHYSICIAN:  Sharlet Salina T. Hoxworth, M.D.DATE OF BIRTH:  Apr 02, 1966   DATE OF ADMISSION:  02/17/2007  DATE OF DISCHARGE:  02/23/2007                               DISCHARGE SUMMARY   DISCHARGE DIAGNOSIS:  Adenocarcinoma upper rectum.   OPERATIONS AND PROCEDURES:  Low anterior resection of the rectum on  February 17, 2007.   HISTORY OF PRESENT ILLNESS:  Osei Anger is a 45 year old male who  underwent a recent colonoscopy due to family history of colon cancer in  his mother.  He had had one episode of recent rectal bleeding as well.  This revealed a mass at about 10 cm from the anal verge, biopsy showing  adenocarcinoma.  A CT scan has shown some thickening of the rectum, but  no evidence of bulky tumor or adenopathy.  A low anterior resection of  the rectum has been recommended; and he is admitted on the morning of  this procedure following a mechanical antibiotic bowel prep at home.   HOSPITAL COURSE:  The patient was admitted on the morning of this  procedure; and underwent uneventful lower anterior resection of the  rectum.  His initial postoperative course was benign.  He was started on  liquids on the first postoperative day which he tolerated well.  Foley  was discontinued on the second postoperative day; and he was voiding  without difficulty.  However, early on March 30 he developed some  episodic bloody bowel movements.  He had been on Lovenox and this was  held.  He continued to have several bloody bowel movements over the  third and fourth postoperative day; and hemoglobin decreased to 9.5 over  a preop of 14.   On the fifth postoperative day, April 1 he was feeling better; he had  had a bowel movement without blood.  Hemoglobin was 8.8.  Diet was  advanced.  He had no further bleeding and had  a couple of more normal  bowel movements.  He was tolerating a regular diet.  Wound was healing  primarily.  Pathology revealed invasive, poorly-differentiated  adenocarcinoma.  The adenocarcinoma was focally invasive in the  pericolonic adipose tissue, but  margins free; and there were 22 lymph nodes with 5 involved with  metastatic carcinoma.  Stage T3N0M0. Arrangements will be made for  oncologic consultation as an outpatient.  Follow up is to be in my  office in 1 week.  He was given a prescription for Tylox for pain.      Lorne Skeens. Hoxworth, M.D.  Electronically Signed     BTH/MEDQ  D:  03/21/2007  T:  03/21/2007  Job:  [id number]   cc:   Vania Rea. Jarold Motto, MD, FACG, FACP, FAGA  [address]  [place]  [place] [address]   Tinnie Gens A. Tawanna Cooler, MD  [address] [place]  [place] [address]

## 2011-04-10 NOTE — Consult Note (Signed)
[facility]  Patient:    [name], [name]                         MRN: [id number] Attending:  Petra Kuba, M.D. CC:         Smitty Cords. Beverely Pace, M.D.   Consultation Report  HISTORY:  Patient, with multiple episodes of dysphagia, getting worse over the last few months, who got some chicken caught today at lunch, went to urgent care.  They gave him some nitroglycerin, which may have helped a little bit, but he has continued to have trouble spitting up, unable to drink fluids, presented to [facility] Emergency Room and Dr. Smitty Cords. Cheek called me to discuss endoscopy with the patient.  He has not had much reflux or heartburn, has not had any previous GI procedures or problems, says his dysphagia has been worse since he had his molars removed some time ago and his food seems to all catch in his mid-chest, not in his neck.  PAST MEDICAL HISTORY:  Pertinent only for a pneumothorax surgery from trauma.  FAMILY HISTORY:  Pertinent for mother with colon cancer -- we did not discuss that today -- but no swallowing problems in the family.  SOCIAL HISTORY:  Denies drug use, much over-the-counter medicines, tobacco or alcohol.  ALLERGIES:  No known allergies.  CURRENT MEDICATIONS:  None.  REVIEW OF SYSTEMS:  Negative except above.  PHYSICAL EXAMINATION:  VITALS:  See chart.  GENERAL:  In no acute distress.  NECK:  Supple without adenopathy.  LUNGS:  Clear.  CARDIAC:  Regular rate and rhythm.  ABDOMEN:  Soft and nontender.  ASSESSMENT:  Probable food impaction in a patient with probably a chronic ring or stricture.  PLAN:  The risks, benefits and methods of EGD with food removal were discussed; we also talked about possibly a dilatation at some point in the future versus medicines for inflammation and will proceed with the endoscopy, as discussed, with further workup and plans; please see that dictation. DD:  06/29/01 TD:  06/30/01 Job:  [id number]

## 2011-04-10 NOTE — Op Note (Signed)
[name], [name]                ACCOUNT NO.:  [id number]   MEDICAL RECORD NO.:  [id number]          PATIENT TYPE:  INP   LOCATION:  X002                         FACILITY:  [facility]   PHYSICIAN:  Sharlet Salina T. Hoxworth, M.D.DATE OF BIRTH:  1966/02/25   DATE OF PROCEDURE:  02/17/2007  DATE OF DISCHARGE:                               OPERATIVE REPORT   PREOPERATIVE DIAGNOSIS:  Adenocarcinoma of the rectum.   POSTOPERATIVE DIAGNOSIS:  Adenocarcinoma of the rectum.   SURGICAL PROCEDURES:  Low anterior resection of the rectum.   SURGEON:  Lorne Skeens. Hoxworth, M.D.   ASSISTANT:  Rose Phi. Maple Hudson, M.D.   ANESTHESIA:  General.   BRIEF HISTORY:  [name] is a 45 year old male with a family history  of colorectal cancer who recently underwent initial screening.  He was  asymptomatic.  Findings were significant for an ulcerated mass at 10 cm  in the rectum with biopsy confirming invasive adenocarcinoma.  Preoperative staging did not show any evidence of bulky or metastatic  disease, and I have recommended proceeding with lower anterior  resection.  The nature of the procedure, indications, risks of bleeding,  infection, anastomotic leaks cardiorespiratory complications were  discussed and understood.  He is brought to the operating room for this  procedure.   DESCRIPTION OF OPERATION:  The patient was brought to the operating room  and placed in the supine position on the operating table.  General  endotracheal anesthesia was induced.  He was carefully positioned and  padded in the semilithotomy position.  Foley catheter and orogastric  tube were placed.  PAs were in place.  He received broad-spectrum  preoperative antibiotics.  He had undergone a mechanical antibiotic  bowel prep.  The abdomen and perineum were widely sterilely prepped and  draped.  Correct patient and procedure were verified.   A low midline incision was used and dissection carried down through  subcutaneous tissue  and midline fascia using cautery and the peritoneum  entered under direct vision.  Thorough exploration was performed.  There  was a tumor not bulky or apparently penetrating through the bowel wall  palpable just at or just above the peritoneal reflection.  There was no  palpable adenopathy.  Liver, retroperitoneum and small bowel all  appeared normal.  The small bowel was packed into the upper abdomen and  Balfour retractor placed.  An appropriate point for division of the  sigmoid colon was chosen, and this was cleaned of mesentery and  pericolic fat and divided with the GIA stapler.  The mesentery on either  side of the sigmoid and rectosigmoid was incised with cautery and  carried down along next to the rectum and over the anterior rectum and  cul-de-sac.  On either side the ureters were identified throughout their  course and protected throughout the remainder of the dissection.  The  mesentery of the distal sigmoid was then taken with the LigaSure device,  working down to the sacral promontory and the avascular presacral space  entered.  Using careful blunt and cautery dissection a plane was  developed anterior to the rectum and  posterior to the seminal vesicles  and upper prostate.  Lateral and anterolateral attachments of mesentery  of the rectum were then taken with the LigaSure device staying well out  on the pelvic sidewall to do a full mesorectal resection around the area  of the tumor. This dissection continued distally until we felt we were  several centimeters distal to the tumor.  At that point the mesorectum  was divided at right angles to the rectum up to the rectal wall which  was cleaned of mesorectum and perirectal fat.  The rectum was then  divided at this point with the contour stapler.  There grossly appeared  to be a good margin distal to the tumor.  The pelvis was irrigated and  hemostasis assured.  The end of the sigmoid colon was then cleaned of  mesentery and  pericolic fat and a 2-0 Prolene pursestring suture placed  with a pursestring device.  This was sized to 29 mm and a 29-mm circular  stapler was chosen, the anvil placed in the end of the sigmoid and the  pursestring secured.  I then went below and the stapler was easily  passed up to the rectal stump and the spike deployed through the center  of the staple line.  The anvil was attached and the stapler closed  excluding all extraneous tissue and then fired and removed easily.  There were 2 solid donuts obtained.  Rigid sigmoidoscopy was then performed and with the anastomosis tightly  distended with air under saline, there was no evidence of leak.  Anastomosis appeared widely patent at about 6 cm with no evidence of  bleeding, and the staple line appeared and felt intact.   Following this, gloves, instruments and gowns were changed.  The abdomen  was thoroughly irrigated and hemostasis assured.  Viscera were returned  to their anatomic positions.  The midline fascia and peritoneum were  closed with running #1 PDS beginning at either end of  the incision and  tied centrally.  Subcutaneous tissue was irrigated and skin closed with  staples.  Sponge, needle and instrument counts were correct.  The  patient was taken to recovery in good condition.      Lorne Skeens. Hoxworth, M.D.  Electronically Signed     BTH/MEDQ  D:  02/17/2007  T:  02/17/2007  Job:  [id number]

## 2011-04-10 NOTE — Procedures (Signed)
[facility]  Patient:    [name], [name]                         MRN: [id number] Proc. Date: 06/29/01 Attending:  Petra Kuba, M.D. CC:         Smitty Cords. Beverely Pace, M.D.   Procedure Report  PROCEDURE:  Esophagogastroduodenoscopy with food disimpaction.  INDICATIONS FOR PROCEDURE:  Probable food impaction.  Consent was signed after risks, benefits, methods, and options were thoroughly discussed prior to any premeds given.  MEDICINES USED:  Demerol 100, Versed 10.  DESCRIPTION OF PROCEDURE:  The video endoscope was inserted by direct vision. He had lots of fluid in his esophagus which was suctioned. In the distal esophagus, obvious chicken obstruction. The esophagus was seen. We first tried the snare and a few pieces were broken off but we could not get the snare completely around the chicken. When we did get a moderate size piece that broke off, we were able to suck it onto the head of the scope. The scope and the piece of chicken was removed. We went ahead and reinserted the scope. I went ahead and grabbed multiple pieces using the tripod grabber. Multiple small pieces were removed but we could not get the majority of the bolus. We did after multiple tripod grabbing with fragmenting the bolus go ahead and try the [facility] retrieval basket which could not get around the bolus either. We did use the snare a few more times and when a small to medium size piece was grabbed, we were able to remove that suctioned onto the head of the scope. Once the scope was reinserted, we went back to the tripod grabber and after a few more fragmentations, we were able to push the remaining piece into the stomach. The scope then passed easily into the stomach, water was suctioned, the scope was retroflexed. There was a small hiatal hernia seen in the cardia. No significant bleeding was seen coming from above. The scope was withdrawn back through the esophagus and all food  particles washed into the stomach. The remaining esophageal fluid was suctioned. In the distal esophagus was moderate inflammation, some heme but no active bleeding at the end of the procedure in a ring. There was also some spasm but no mass lesion. The scope was quickly advanced back into the stomach, water was suctioned, and then we advanced through a normal antrum, normal pylorus into a normal duodenal bulb and around the C loop to a normal second portion of the duodenum. The scope was withdrawn back to the stomach and on straight visualization, no additional findings were seen. More air and water were suctioned. Again on slow withdrawal through the esophagus, it confirmed the above findings. The scope was removed. The patient tolerated the procedure adequately. There was on obvious or immediate complications.  ENDOSCOPIC DIAGNOSIS: 1. Small hiatal hernia with ring. 2. Food impaction status post removal using the snare Roth retrieval basket    minimally and the tripod grabbers. 3. Distal esophageal trauma and spasm after removal. 4. Otherwise within normal limits EGD with a quick look at the stomach    and bulb and second portion of the duodenum.  PLAN:  Will begin Prilosec, clear liquids only for 12 hours. Call me back p.r.n. otherwise soft solids for one to two days and then slowly advance diet. He will call my nurse for an appointment and see him back in two to three weeks to  decide if an EGD with dilatation is needed versus continuing medical therapy. DD:  06/29/01 TD:  06/30/01 Job: [id number]

## 2011-04-10 NOTE — Assessment & Plan Note (Signed)
[facility]                            [facility] OFFICE NOTE   NAME:[name], RAMESES OU                       MRN:          [id number]  DATE:12/10/2006                            DOB:          Mar 27, 1966    PHYSICAL EVALUATION - ESTABLISHED PATIENT   Mr. 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   PAST MEDICAL HISTORY:  He was hospitalized at age 19, fell off a roof,  had broken ribs, pneumothorax, recovered without any major sequelae.  He  also had chicken stuck in his esophagus, had to have it removed  endoscopically in the emergency room.  Since that time, he has done well  but, most recently, he is having difficulty swallowing.  We will set up  for an upper endoscopy p.r.n.   PAST ILLNESSES:  None.   INJURIES:  None.   DRUG ALLERGIES:  None.   There is no smoking, drinking alcohol, or use of drugs.  Does not take  any medicines on a regular basis.   REVIEW OF SYSTEMS:  HEAD, EYES, EARS, NOSE, AND THROAT:  Negative.  He  gets irregular dental care.  Gave him Dr. Shannan Harper name for regular dental  care.  CARDIOPULMONARY:  Negative.  GI:  Pertinent as noted above.  He  is having some trouble swallowing.  He needs the endoscopy for upper  evaluation.  He has also had bright red rectal bleeding and his mother  had colon cancer diagnosed at age 53.  Therefore, he will need  colonoscopy this winter.  GU:  Negative.  ENDO:  Negative.  Weight  steady.  MUSCULOSKELETAL:  Negative.  VASCULAR:  Negative.  ALLERGY:  Negative.  The rest of the review of systems was negative.   SOCIAL HISTORY:  He is from the [place] and he works as his own boss,  runs a Psychologist, sport and exercise.  He is single, no children.   FAMILY HISTORY:  Dad died 79, had multiple myeloma.  Mother is  25,  alive, well.  She has had a history of colon cancer, diagnosed at 38,  also had underlying asthma.  One brother, 1 sister, both older in good  health, no problems.   IMMUNIZATION HISTORY:  Last tetanus 2005.   PHYSICAL EVALUATION:  Height 5 feet 7 inches.  Weight 178.  Temp 97.2.  Pulse 60 and regular and BP 130/76.  GENERAL:  He is a well-developed, muscular male in no acute distress.  HEAD, EYES, EARS, NOSE, AND THROAT:  Negative.  NECK:  Supple.  Thyroid is not enlarged.  No carotid bruits.  CHEST:  Clear to auscultation.  There is a scar on the back from a previous injury when he was 20.  LUNGS:  Clear to auscultation.  CARDIAC:  Negative.  ABDOMEN:  Negative.  GENITALIA:  Normal circumcised male.  RECTAL:  Normal.  Stool guaiac negative.  Prostate normal.  EXTREMITIES:  Normal.  Peripheral  pulses normal.  SKIN:  Normal.   LABORATORY DATA:  Showed a normal CBC, lipid panel, metabolic panel, and  TSH and urine.   IMPRESSION, COUNSELING, PROBLEM LIST:  1. Bright red rectal bleeding, now resolved with hot soaks, Anusol-HC,      and stool softeners.  However, with a family history of colon      cancer, he needs to be screened for that disease.  Plan:  We will      get him set up in [facility]astro[facility] for a screening colonoscopy.  2. History of upper endoscopy for a piece of chicken that was lodged.      Now, he is having some dysphagia.  We will also need upper      endoscopy and he has probably got a stricture there.     Jeffrey A. Tawanna Cooler, MD  Electronically Signed    JAT/MedQ  DD: 12/10/2006  DT: 12/10/2006  Job #: [id number]   cc:   [facility]

## 2012-02-15 ENCOUNTER — Encounter: Payer: Self-pay | Admitting: Gastroenterology

## 2012-09-28 ENCOUNTER — Encounter: Payer: Self-pay | Admitting: Gastroenterology

## 2014-06-21 ENCOUNTER — Encounter: Payer: Self-pay | Admitting: Gastroenterology

## 2016-02-10 ENCOUNTER — Encounter: Payer: Self-pay | Admitting: Family Medicine

## 2016-02-10 ENCOUNTER — Ambulatory Visit (INDEPENDENT_AMBULATORY_CARE_PROVIDER_SITE_OTHER): Payer: BLUE CROSS/BLUE SHIELD | Admitting: Family Medicine

## 2016-02-10 ENCOUNTER — Encounter: Payer: Self-pay | Admitting: Gastroenterology

## 2016-02-10 VITALS — BP 156/95 | HR 58 | Temp 98.6°F | Resp 20 | Wt 182.0 lb

## 2016-02-10 DIAGNOSIS — Z7189 Other specified counseling: Secondary | ICD-10-CM

## 2016-02-10 DIAGNOSIS — Z13220 Encounter for screening for lipoid disorders: Secondary | ICD-10-CM

## 2016-02-10 DIAGNOSIS — R030 Elevated blood-pressure reading, without diagnosis of hypertension: Secondary | ICD-10-CM | POA: Insufficient documentation

## 2016-02-10 DIAGNOSIS — Z7689 Persons encountering health services in other specified circumstances: Secondary | ICD-10-CM

## 2016-02-10 DIAGNOSIS — Z85038 Personal history of other malignant neoplasm of large intestine: Secondary | ICD-10-CM | POA: Diagnosis not present

## 2016-02-10 DIAGNOSIS — Z131 Encounter for screening for diabetes mellitus: Secondary | ICD-10-CM | POA: Diagnosis not present

## 2016-02-10 DIAGNOSIS — Z125 Encounter for screening for malignant neoplasm of prostate: Secondary | ICD-10-CM | POA: Diagnosis not present

## 2016-02-10 DIAGNOSIS — IMO0001 Reserved for inherently not codable concepts without codable children: Secondary | ICD-10-CM

## 2016-02-10 LAB — CBC WITH DIFFERENTIAL/PLATELET
BASOS PCT: 1 % (ref 0–1)
Basophils Absolute: 0.1 10*3/uL (ref 0.0–0.1)
EOS ABS: 0.4 10*3/uL (ref 0.0–0.7)
EOS PCT: 6 % — AB (ref 0–5)
HCT: 43.2 % (ref 39.0–52.0)
Hemoglobin: 15.3 g/dL (ref 13.0–17.0)
Lymphocytes Relative: 27 % (ref 12–46)
Lymphs Abs: 1.7 10*3/uL (ref 0.7–4.0)
MCH: 30.6 pg (ref 26.0–34.0)
MCHC: 35.4 g/dL (ref 30.0–36.0)
MCV: 86.4 fL (ref 78.0–100.0)
MONO ABS: 0.6 10*3/uL (ref 0.1–1.0)
MPV: 11.7 fL (ref 8.6–12.4)
Monocytes Relative: 9 % (ref 3–12)
NEUTROS ABS: 3.5 10*3/uL (ref 1.7–7.7)
Neutrophils Relative %: 57 % (ref 43–77)
Platelets: 240 10*3/uL (ref 150–400)
RBC: 5 MIL/uL (ref 4.22–5.81)
RDW: 13.9 % (ref 11.5–15.5)
WBC: 6.2 10*3/uL (ref 4.0–10.5)

## 2016-02-10 LAB — HEMOGLOBIN A1C
Hgb A1c MFr Bld: 5.6 % (ref <5.7)
MEAN PLASMA GLUCOSE: 114 mg/dL (ref <117)

## 2016-02-10 LAB — COMPREHENSIVE METABOLIC PANEL
ALBUMIN: 4 g/dL (ref 3.6–5.1)
ALT: 18 U/L (ref 9–46)
AST: 17 U/L (ref 10–35)
Alkaline Phosphatase: 56 U/L (ref 40–115)
BUN: 16 mg/dL (ref 7–25)
CALCIUM: 9.1 mg/dL (ref 8.6–10.3)
CHLORIDE: 103 mmol/L (ref 98–110)
CO2: 28 mmol/L (ref 20–31)
Creat: 1.21 mg/dL (ref 0.70–1.33)
Glucose, Bld: 87 mg/dL (ref 65–99)
POTASSIUM: 3.8 mmol/L (ref 3.5–5.3)
Sodium: 141 mmol/L (ref 135–146)
Total Bilirubin: 0.3 mg/dL (ref 0.2–1.2)
Total Protein: 6.8 g/dL (ref 6.1–8.1)

## 2016-02-10 NOTE — Patient Instructions (Signed)
It was a pleasure meeting you today. I will call you with lab results once available.  Make a lab appt for fasting lipid panel. ( no eating for 9 hours before test) Referral to GI placed today to schedule your follow up.  Blood pressure: elevated today, when you have your labs collected you can also have a nurse visit that day to recheck BP.

## 2016-02-10 NOTE — Progress Notes (Signed)
Patient ID: [name], male   DOB: [birth year], 50 y.o.   MRN: [id number]      Patient ID: [name], male  DOB: 1966-03-09, 50 y.o.   MRN: [id number]  Subjective:  [name] is a 50 y.o. male present for establishment of care. All past medical history, surgical history, allergies, family history, immunizations, medications and social history were updated and entered  in the electronic medical record today. All recent labs, ED visits and hospitalizations within the last year were reviewed.  Patient presents for establishment of care, with lack of medical follow-up for the last 7 years. Patient has a significant past medical history of colon cancer stage III, with colon resection, hepatitis B and a pneumothorax. He reports he has not followed up with his gastroenterologist, surgeon or oncologist since 2010. Patient denies any bowel changes, melena or hematochezia during that time. He does admit to multiple bowel movements a day, that he contributes to his colon resection. He denies any night sweats or unintentional weight loss. He states that he feels like he is taking better care of himself and he did in the past. He does admit to lymphadenitis in his neck, that he feels is secondary to his teeth. He does not have a dentist as of yet. 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 He has not had any lab work or been seen in any viewable urgent cares or emergency rooms.  Health maintenance:  Colonoscopy: 2010, colon resection in 2008 secondary to colon cancer. Mother also had colon cancer, and his 83 year old daughter has had colon polyps. Immunizations: tdap UTD (2012), declines flu shot  Infectious disease screening:HIV and hep c screen indicated, will offer him preventive exam. PSA: No prior PSA.  Past Medical History  Diagnosis Date  . Cancer ([place])     colon  . Hepatitis B   . Chicken pox   . Pneumothorax   . Lymphadenitis    No Known  Allergies Past Surgical History  Procedure Laterality Date  . Colon surgery      resection   Family History  Problem Relation Age of Onset  . Arthritis Mother   . Cancer Mother   . COPD Mother   . Heart disease Mother   . Colon cancer Mother   . Colon polyps Daughter    Social History   Social History  . Marital Status: Single    Spouse Name: N/A  . Number of Children: N/A  . Years of Education: N/A   Occupational History  . Not on file.   Social History Main Topics  . Smoking status: Former Research scientist (life sciences)  . Smokeless tobacco: Never Used  . Alcohol Use: No  . Drug Use: No  . Sexual Activity: Yes    Birth Control/ Protection: None     Comment: Male partner   Other Topics Concern  . Not on file   Social History Narrative   Single, 1 child.   Self-employed, college education.   Drinks caffeinated beverages, uses herbal remedies.   Wears his seatbelt, exercises greater than 3 times a week.   Smoke detector in the home, firearms in the home in a locked cabinet.   Feels safe in his relationships.    ROS: Negative, with the exception of above mentioned in HPI  Objective: BP 156/95 mmHg  Pulse 58  Temp(Src) 98.6 F (37 C)  Resp 20  Wt 182 lb (82.555 kg)  SpO2 96%  There  is no height on file to calculate BMI. Gen: Afebrile. No acute distress. Nontoxic in appearance, well-developed, well-nourished, male, Caucasian, pleasant HENT: AT. [place].  MMM, no oral lesions, Eyes:Pupils Equal Round Reactive to light, Extraocular movements intact,  Conjunctiva without redness, discharge or icterus. Neck/lymp/endocrine: Supple, mild posterior cervical lymphadenopathy CV: RRR, no edema Chest: CTAB, no wheeze, rhonchi or crackles. Abd: Soft. Flat, periumbilical/lower abdominal midline scar..NTND. BS present. No Masses palpated. No hepatosplenomegaly. No rebound tenderness or guarding. Skin: No rashes, purpura or petechiae. Neuro/Msk: Normal gait. PERLA. EOMi. Alert. Oriented x3.     Psych: Normal affect, dress and demeanor. Normal speech. Normal thought content and judgment.  Assessment/plan: [name] is a 50 y.o. male present for establishment of care.  1. H/O colon cancer, stage III - Patient lost follow-up been at least 7 years since his last colonoscopy with a personal history of colon cancer and colon resection. - CBC w/Diff - Comp Met (CMET) - Ambulatory referral to Gastroenterology; prior Dr. Sharlett Iles patient. Patient needs to reestablish with gastroenterologist for routine follow-up secondary to colon cancer.  2. Elevated BP - Repeat blood pressure today showed mildly elevated blood pressures. Patient will have a fasting lab in a week, he will make a nurse's visit for that lab appointment as well to have his blood pressure rechecked. If still elevated at that time will need to start medications, and follow-up in 4 weeks. Patient is aware of plan. -Exercise at least 150 minutes a week, low-salt diet. - CBC w/Diff - Comp Met (CMET) - HgB A1c - Lipid panel; Future  3. Prostate cancer screening - No prior PSA obtained. He has a history of colon cancer. - PSA  4. Diabetes mellitus screening - HgB A1c  5. Screening cholesterol level - Lipid panel; Future, patient to come in within 1 week for a lab appointment for fasting labs.    Follow-up dependent on lab results and nurse visit for blood pressure. Greater than 45 minutes was spent with patient, greater than 50% of that time was spent face-to-face with patient counseling and coordinating care.   Electronically signed by: Howard Pouch, DO [place]

## 2016-02-11 ENCOUNTER — Telehealth: Payer: Self-pay | Admitting: Family Medicine

## 2016-02-11 LAB — PSA: PSA: 0.46 ng/mL (ref <=4.00)

## 2016-02-11 NOTE — Telephone Encounter (Signed)
Left message on patient voice mail with lab results 

## 2016-02-11 NOTE — Telephone Encounter (Signed)
Please call pt: - All lab work is normal. - Please encourage him to have his fasting lipids (lab) and repeat BP check (nurse appt) completed within a week.Marland Kitchen

## 2016-02-12 ENCOUNTER — Ambulatory Visit (INDEPENDENT_AMBULATORY_CARE_PROVIDER_SITE_OTHER): Payer: BLUE CROSS/BLUE SHIELD | Admitting: *Deleted

## 2016-02-12 ENCOUNTER — Other Ambulatory Visit (INDEPENDENT_AMBULATORY_CARE_PROVIDER_SITE_OTHER): Payer: BLUE CROSS/BLUE SHIELD

## 2016-02-12 VITALS — BP 146/87 | HR 58 | Resp 20

## 2016-02-12 DIAGNOSIS — R030 Elevated blood-pressure reading, without diagnosis of hypertension: Secondary | ICD-10-CM

## 2016-02-12 DIAGNOSIS — IMO0001 Reserved for inherently not codable concepts without codable children: Secondary | ICD-10-CM

## 2016-02-12 LAB — LIPID PANEL
CHOLESTEROL: 175 mg/dL (ref 0–200)
HDL: 37.6 mg/dL — ABNORMAL LOW (ref >39.00)
LDL CALC: 119 mg/dL — AB (ref 0–99)
NonHDL: 137.23
TRIGLYCERIDES: 93 mg/dL (ref 0.0–149.0)
Total CHOL/HDL Ratio: 5
VLDL: 18.6 mg/dL (ref 0.0–40.0)

## 2016-02-12 NOTE — Progress Notes (Signed)
Patient presents today for blood pressure check per Dr Raoul Pitch. Blood pressure lower from previous visit. Dr Raoul Pitch aware. Instructions given to patient to monitor Blood Pressure at home goal <140/90. Low salt diet recommended. Patient verbalized understanding of all instructions.

## 2016-02-13 ENCOUNTER — Encounter: Payer: Self-pay | Admitting: *Deleted

## 2016-02-13 ENCOUNTER — Telehealth: Payer: Self-pay | Admitting: Family Medicine

## 2016-02-13 NOTE — Addendum Note (Signed)
Addended by: Howard Pouch A on: 02/13/2016 09:32 AM   Modules accepted: Level of Service

## 2016-02-13 NOTE — Telephone Encounter (Signed)
Spoke with patient reviewed results and instructions . Patient verbalized understanding. 

## 2016-02-13 NOTE — Telephone Encounter (Signed)
Please call pt: - cholesterol levels: Good cholesterol should be a little higher and bad cholesterol just a little lower.  Eating foods such as olive oil, salmon, whole grains  and/or fish oil supplementation daily, along with exercise can correct the cholesterol levels.

## 2016-02-13 NOTE — Progress Notes (Signed)
Patient ID: [name], male   DOB: 23-Oct-1966, 50 y.o.   MRN: [id number] BP noted, no changes at this time. Nurse instructed on orders to communicate to patient and documented.

## 2016-02-14 ENCOUNTER — Encounter: Payer: Self-pay | Admitting: Family Medicine

## 2016-03-12 ENCOUNTER — Ambulatory Visit (AMBULATORY_SURGERY_CENTER): Payer: Self-pay | Admitting: *Deleted

## 2016-03-12 VITALS — Ht 67.0 in | Wt 186.0 lb

## 2016-03-12 DIAGNOSIS — Z85038 Personal history of other malignant neoplasm of large intestine: Secondary | ICD-10-CM

## 2016-03-12 NOTE — Progress Notes (Signed)
No egg or soy allergy. No anesthesia problems.  No home O2.  No diet meds.  

## 2016-03-26 ENCOUNTER — Other Ambulatory Visit: Payer: Self-pay

## 2016-03-26 ENCOUNTER — Telehealth: Payer: Self-pay | Admitting: Gastroenterology

## 2016-03-26 ENCOUNTER — Ambulatory Visit (AMBULATORY_SURGERY_CENTER): Payer: BLUE CROSS/BLUE SHIELD | Admitting: Gastroenterology

## 2016-03-26 ENCOUNTER — Encounter: Payer: Self-pay | Admitting: Gastroenterology

## 2016-03-26 VITALS — BP 140/102 | HR 68 | Temp 96.0°F | Resp 12 | Ht 67.0 in | Wt 186.0 lb

## 2016-03-26 DIAGNOSIS — D124 Benign neoplasm of descending colon: Secondary | ICD-10-CM

## 2016-03-26 DIAGNOSIS — K635 Polyp of colon: Secondary | ICD-10-CM

## 2016-03-26 DIAGNOSIS — Z85038 Personal history of other malignant neoplasm of large intestine: Secondary | ICD-10-CM

## 2016-03-26 DIAGNOSIS — Z800 Family history of malignant neoplasm of digestive organs: Secondary | ICD-10-CM | POA: Diagnosis not present

## 2016-03-26 DIAGNOSIS — D120 Benign neoplasm of cecum: Secondary | ICD-10-CM | POA: Diagnosis not present

## 2016-03-26 MED ORDER — SODIUM CHLORIDE 0.9 % IV SOLN: 500.0000 mL | INTRAVENOUS | Status: DC

## 2016-03-26 NOTE — Telephone Encounter (Signed)
Please send a referral for this patient to the [facility] re: personal and family history of colon cancer  I discussed it with him today, he is willing to see them and will await their call with the appointment details.  Thanks  - HD

## 2016-03-26 NOTE — Op Note (Signed)
[facility] Patient Name: [name] Procedure Date: 03/26/2016 8:34 AM MRN: [id number] Endoscopist: Mallie Mussel L. Loletha Carrow , MD Age: 50 Date of Birth: Sep 08, 1966 Gender: Male Procedure:                Colonoscopy Indications:              Screening in patient at increased risk: Colorectal                            cancer in mother 24 or older, High risk colon                            cancer surveillance: Personal history of colon                            cancer Medicines:                Monitored Anesthesia Care Procedure:                Pre-Anesthesia Assessment:                           - Prior to the procedure, a History and Physical                            was performed, and patient medications and                            allergies were reviewed. The patient's tolerance of                            previous anesthesia was also reviewed. The risks                            and benefits of the procedure and the sedation                            options and risks were discussed with the patient.                            All questions were answered, and informed consent                            was obtained. Prior Anticoagulants: The patient has                            taken no previous anticoagulant or antiplatelet                            agents. ASA Grade Assessment: II - A patient with                            mild systemic disease. After reviewing the risks  and benefits, the patient was deemed in                            satisfactory condition to undergo the procedure.                           After obtaining informed consent, the colonoscope                            was passed under direct vision. Throughout the                            procedure, the patient's blood pressure, pulse, and                            oxygen saturations were monitored continuously. The                            Model CF-HQ190L  [id number]) scope was introduced                            through the anus and advanced to the the cecum,                            identified by appendiceal orifice and ileocecal                            valve. The colonoscopy was performed without                            difficulty. The patient tolerated the procedure                            well. The quality of the bowel preparation was                            good. The ileocecal valve, appendiceal orifice, and                            rectum were photographed. The bowel preparation                            used was Miralax. Scope In: 8:42:33 AM Scope Out: 8:57:24 AM Scope Withdrawal Time: 0 hours 12 minutes 11 seconds  Total Procedure Duration: 0 hours 14 minutes 51 seconds  Findings:                 The perianal and digital rectal examinations were                            normal except the palpable surgical anastomosis.                           A 2 mm polyp was found in the cecum. The polyp was  sessile. The polyp was removed with a cold biopsy                            forceps. Resection and retrieval were complete.                           A 2 mm polyp was found in the distal descending                            colon. The polyp was sessile. The polyp was removed                            with a piecemeal technique using a cold biopsy                            forceps. Resection and retrieval were complete.                           There was evidence of a prior end-to-end                            colo-colonic anastomosis in the mid rectum. This                            was patent and was characterized by healthy                            appearing mucosa.                           Retroflexion in the rectum was not performed due to                            post-surgical anatomy.                           The exam was otherwise without abnormality. Complications:             No immediate complications. Estimated Blood Loss:     Estimated blood loss: none. Impression:               - One 2 mm polyp in the cecum, removed with a cold                            biopsy forceps. Resected and retrieved.                           - One 2 mm polyp in the distal descending colon,                            removed piecemeal using a cold biopsy forceps.                            Resected and retrieved.                           -  Patent end-to-end colo-colonic anastomosis,                            characterized by healthy appearing mucosa.                           - The examination was otherwise normal. Recommendation:           - Patient has a contact number available for                            emergencies. The signs and symptoms of potential                            delayed complications were discussed with the                            patient. Return to normal activities tomorrow.                            Written discharge instructions were provided to the                            patient.                           - Resume previous diet.                           - Continue present medications.                           - Await pathology results.                           - Repeat colonoscopy in 5 years for surveillance.                           - The patient will be referred to a genetic                            counselor due to his colon cancer diagnosis in his                            early 50's. Henry L. Loletha Carrow, MD 03/26/2016 9:15:06 AM This report has been signed electronically.

## 2016-03-26 NOTE — Patient Instructions (Signed)
YOU HAD AN ENDOSCOPIC PROCEDURE TODAY AT [facility]:   Refer to the procedure report that was given to you for any specific questions about what was found during the examination.  If the procedure report does not answer your questions, please call your gastroenterologist to clarify.  If you requested that your care partner not be given the details of your procedure findings, then the procedure report has been included in a sealed envelope for you to review at your convenience later.  YOU SHOULD EXPECT: Some feelings of bloating in the abdomen. Passage of more gas than usual.  Walking can help get rid of the air that was put into your GI tract during the procedure and reduce the bloating. If you had a lower endoscopy (such as a colonoscopy or flexible sigmoidoscopy) you may notice spotting of blood in your stool or on the toilet paper. If you underwent a bowel prep for your procedure, you may not have a normal bowel movement for a few days.  Please Note:  You might notice some irritation and congestion in your nose or some drainage.  This is from the oxygen used during your procedure.  There is no need for concern and it should clear up in a day or so.  SYMPTOMS TO REPORT IMMEDIATELY:   Following lower endoscopy (colonoscopy or flexible sigmoidoscopy):  Excessive amounts of blood in the stool  Significant tenderness or worsening of abdominal pains  Swelling of the abdomen that is new, acute  Fever of 100F or higher   For urgent or emergent issues, a gastroenterologist can be reached at any hour by calling [phone].   DIET: Your first meal following the procedure should be a small meal and then it is ok to progress to your normal diet. Heavy or fried foods are harder to digest and may make you feel nauseous or bloated.  Likewise, meals heavy in dairy and vegetables can increase bloating.  Drink plenty of fluids but you should avoid alcoholic beverages for 24  hours.  ACTIVITY:  You should plan to take it easy for the rest of today and you should NOT DRIVE or use heavy machinery until tomorrow (because of the sedation medicines used during the test).    FOLLOW UP: Our staff will call the number listed on your records the next business day following your procedure to check on you and address any questions or concerns that you may have regarding the information given to you following your procedure. If we do not reach you, we will leave a message.  However, if you are feeling well and you are not experiencing any problems, there is no need to return our call.  We will assume that you have returned to your regular daily activities without incident.  If any biopsies were taken you will be contacted by phone or by letter within the next 1-3 weeks.  Please call us at [phone] if you have not heard about the biopsies in 3 weeks.    SIGNATURES/CONFIDENTIALITY: You and/or your care partner have signed paperwork which will be entered into your electronic medical record.  These signatures attest to the fact that that the information above on your After Visit Summary has been reviewed and is understood.  Full responsibility of the confidentiality of this discharge information lies with you and/or your care-partner.  Polyp information given.  Colonoscopy in 5 years-2022  Referral to [facility].  Dr. Loletha Carrow office will set this up for you.

## 2016-03-26 NOTE — Progress Notes (Signed)
A/ox3 pleased with MAC, report to Jane RN 

## 2016-03-26 NOTE — Progress Notes (Signed)
Called to room to assist during endoscopic procedure.  Patient ID and intended procedure confirmed with present staff. Received instructions for my participation in the procedure from the performing physician.  

## 2016-03-26 NOTE — Telephone Encounter (Signed)
Christian Mate RN for Dr Loletha Carrow has already initiated the referral.

## 2016-03-27 ENCOUNTER — Telehealth: Payer: Self-pay

## 2016-03-27 NOTE — Telephone Encounter (Signed)
  Follow up Call-  Call back number 03/26/2016  Post procedure Call Back phone  # [phone] cell  Permission to leave phone message Yes    Patient was called for follow up after his procedure on 03/26/2016. No answer at the number given for follow up phone call. A message was left on the answering machine.

## 2016-03-31 ENCOUNTER — Encounter: Payer: Self-pay | Admitting: Gastroenterology

## 2016-04-01 ENCOUNTER — Telehealth: Payer: Self-pay | Admitting: Gastroenterology

## 2016-04-01 ENCOUNTER — Encounter: Payer: Self-pay | Admitting: Genetic Counselor

## 2016-04-01 ENCOUNTER — Telehealth: Payer: Self-pay | Admitting: Genetic Counselor

## 2016-04-01 ENCOUNTER — Encounter: Payer: Self-pay | Admitting: Family Medicine

## 2016-04-01 ENCOUNTER — Telehealth: Payer: Self-pay

## 2016-04-01 NOTE — Telephone Encounter (Signed)
Verified address and insurance, contacted pt completed intake and confirmed appt.

## 2016-04-01 NOTE — Telephone Encounter (Signed)
Letter mailed to the pt he has been notified and will call if he does not hear from the genetics office.  Dear [name],  The polyps removed from your colon were precancerous. This means that they had the potential to change into cancer over time.  I recommend you have a repeat colonoscopy in 5 years to determine if you have developed any new polyps and to screen for colorectal cancer.  In addition, as we discussed, my office has sent a referral to the [facility] because of your history of colon cancer at a young age.  If you do not hear from that clinic within the next month regarding an appointment, please contact my office. It is possible that the genetic testing may indicate the need for [place] to perform your next colonoscopy sooner than 5 years from now.   If you develop any new rectal bleeding, abdominal pain or significant bowel habit changes, please contact us before then at Dept: [phone].

## 2016-04-01 NOTE — Telephone Encounter (Signed)
[name],         [name] has a genetic counseling appt on 05/14/16 at 2 pm with Jeanine Luz.        Thanks    [employer]

## 2016-05-14 ENCOUNTER — Other Ambulatory Visit: Payer: BLUE CROSS/BLUE SHIELD

## 2016-05-14 ENCOUNTER — Encounter: Payer: BLUE CROSS/BLUE SHIELD | Admitting: Genetic Counselor

## 2017-02-07 ENCOUNTER — Encounter: Payer: Self-pay | Admitting: Family Medicine

## 2017-02-10 ENCOUNTER — Encounter: Payer: Self-pay | Admitting: Family Medicine

## 2017-02-10 ENCOUNTER — Ambulatory Visit (INDEPENDENT_AMBULATORY_CARE_PROVIDER_SITE_OTHER): Payer: 59 | Admitting: Family Medicine

## 2017-02-10 VITALS — BP 148/96 | HR 61 | Temp 97.9°F | Resp 20 | Ht 67.0 in | Wt 186.8 lb

## 2017-02-10 DIAGNOSIS — H6122 Impacted cerumen, left ear: Secondary | ICD-10-CM | POA: Diagnosis not present

## 2017-02-10 DIAGNOSIS — Z131 Encounter for screening for diabetes mellitus: Secondary | ICD-10-CM

## 2017-02-10 DIAGNOSIS — M79604 Pain in right leg: Secondary | ICD-10-CM

## 2017-02-10 DIAGNOSIS — Z0000 Encounter for general adult medical examination without abnormal findings: Secondary | ICD-10-CM | POA: Insufficient documentation

## 2017-02-10 DIAGNOSIS — E663 Overweight: Secondary | ICD-10-CM | POA: Insufficient documentation

## 2017-02-10 DIAGNOSIS — Z1329 Encounter for screening for other suspected endocrine disorder: Secondary | ICD-10-CM | POA: Diagnosis not present

## 2017-02-10 DIAGNOSIS — I10 Essential (primary) hypertension: Secondary | ICD-10-CM

## 2017-02-10 DIAGNOSIS — Z114 Encounter for screening for human immunodeficiency virus [HIV]: Secondary | ICD-10-CM

## 2017-02-10 DIAGNOSIS — Z13220 Encounter for screening for lipoid disorders: Secondary | ICD-10-CM | POA: Diagnosis not present

## 2017-02-10 DIAGNOSIS — Z85038 Personal history of other malignant neoplasm of large intestine: Secondary | ICD-10-CM | POA: Diagnosis not present

## 2017-02-10 DIAGNOSIS — Z125 Encounter for screening for malignant neoplasm of prostate: Secondary | ICD-10-CM

## 2017-02-10 DIAGNOSIS — Z6829 Body mass index (BMI) 29.0-29.9, adult: Secondary | ICD-10-CM | POA: Diagnosis not present

## 2017-02-10 LAB — COMPLETE METABOLIC PANEL WITH GFR
ALBUMIN: 4 g/dL (ref 3.6–5.1)
ALK PHOS: 53 U/L (ref 40–115)
ALT: 26 U/L (ref 9–46)
AST: 22 U/L (ref 10–35)
BILIRUBIN TOTAL: 0.5 mg/dL (ref 0.2–1.2)
BUN: 17 mg/dL (ref 7–25)
CALCIUM: 9.1 mg/dL (ref 8.6–10.3)
CHLORIDE: 109 mmol/L (ref 98–110)
CO2: 20 mmol/L (ref 20–31)
CREATININE: 1.08 mg/dL (ref 0.70–1.33)
GFR, Est Non African American: 79 mL/min (ref >=60)
Glucose, Bld: 90 mg/dL (ref 65–99)
Potassium: 4.2 mmol/L (ref 3.5–5.3)
Sodium: 144 mmol/L (ref 135–146)
TOTAL PROTEIN: 6.8 g/dL (ref 6.1–8.1)

## 2017-02-10 LAB — MICROALBUMIN / CREATININE URINE RATIO
Creatinine,U: 156.5 mg/dL
MICROALB UR: 2.4 mg/dL — AB (ref 0.0–1.9)
MICROALB/CREAT RATIO: 1.5 mg/g (ref 0.0–30.0)

## 2017-02-10 LAB — CBC WITH DIFFERENTIAL/PLATELET
BASOS ABS: 0.1 10*3/uL (ref 0.0–0.1)
BASOS PCT: 1 % (ref 0.0–3.0)
Eosinophils Absolute: 0.5 10*3/uL (ref 0.0–0.7)
Eosinophils Relative: 8.1 % — ABNORMAL HIGH (ref 0.0–5.0)
HEMATOCRIT: 43.2 % (ref 39.0–52.0)
Hemoglobin: 14.8 g/dL (ref 13.0–17.0)
LYMPHS PCT: 26.4 % (ref 12.0–46.0)
Lymphs Abs: 1.7 10*3/uL (ref 0.7–4.0)
MCHC: 34.2 g/dL (ref 30.0–36.0)
MCV: 86.1 fl (ref 78.0–100.0)
MONOS PCT: 9.2 % (ref 3.0–12.0)
Monocytes Absolute: 0.6 10*3/uL (ref 0.1–1.0)
NEUTROS ABS: 3.6 10*3/uL (ref 1.4–7.7)
Neutrophils Relative %: 55.3 % (ref 43.0–77.0)
PLATELETS: 223 10*3/uL (ref 150.0–400.0)
RBC: 5.02 Mil/uL (ref 4.22–5.81)
RDW: 14.3 % (ref 11.5–15.5)
WBC: 6.6 10*3/uL (ref 4.0–10.5)

## 2017-02-10 LAB — TSH: TSH: 1.29 u[IU]/mL (ref 0.35–4.50)

## 2017-02-10 LAB — LDL CHOLESTEROL, DIRECT: Direct LDL: 101 mg/dL

## 2017-02-10 LAB — PSA: PSA: 0.42 ng/mL (ref 0.10–4.00)

## 2017-02-10 MED ORDER — CARBAMIDE PEROXIDE 6.5 % OT SOLN: 5.0000 [drp] | Freq: Two times a day (BID) | OTIC | 2 refills | Status: DC

## 2017-02-10 MED ORDER — LISINOPRIL 5 MG PO TABS: 5.0000 mg | ORAL_TABLET | Freq: Every day | ORAL | 0 refills | Status: DC

## 2017-02-10 NOTE — Patient Instructions (Addendum)
You BP has been elevated, and never normal, over multiple office settings. This surpasses the criteria needed for the diagnosis of hypertension. You will need to star ta medication called lisinopril, I have called this in to your pharmacy, It is a once a day medicine. I would like you to follow up in 10 days after starting to recheck BP and we can adjust dose at that time if needed. Once stable we will follow every 6 months on your BP.  Low sodium diet and exercise will help  Bring down BP.   I hava called in a solution for your ear to use every 2 weeks to keep wax to a minimum.    Hypertension Hypertension is another name for high blood pressure. High blood pressure forces your heart to work harder to pump blood. This can cause problems over time. There are two numbers in a blood pressure reading. There is a top number (systolic) over a bottom number (diastolic). It is best to have a blood pressure below 120/80. Healthy choices can help lower your blood pressure. You may need medicine to help lower your blood pressure if:  Your blood pressure cannot be lowered with healthy choices.  Your blood pressure is higher than 130/80. Follow these instructions at home: Eating and drinking   If directed, follow the DASH eating plan. This diet includes:  Filling half of your plate at each meal with fruits and vegetables.  Filling one quarter of your plate at each meal with whole grains. Whole grains include whole wheat pasta, brown rice, and whole grain bread.  Eating or drinking low-fat dairy products, such as skim milk or low-fat yogurt.  Filling one quarter of your plate at each meal with low-fat (lean) proteins. Low-fat proteins include fish, skinless chicken, eggs, beans, and tofu.  Avoiding fatty meat, cured and processed meat, or chicken with skin.  Avoiding premade or processed food.  Eat less than 1,500 mg of salt (sodium) a day.  Limit alcohol use to no more than 1 drink a day for  nonpregnant women and 2 drinks a day for men. One drink equals 12 oz of beer, 5 oz of wine, or 1 oz of hard liquor. Lifestyle   Work with your doctor to stay at a healthy weight or to lose weight. Ask your doctor what the best weight is for you.  Get at least 30 minutes of exercise that causes your heart to beat faster (aerobic exercise) most days of the week. This may include walking, swimming, or biking.  Get at least 30 minutes of exercise that strengthens your muscles (resistance exercise) at least 3 days a week. This may include lifting weights or pilates.  Do not use any products that contain nicotine or tobacco. This includes cigarettes and e-cigarettes. If you need help quitting, ask your doctor.  Check your blood pressure at home as told by your doctor.  Keep all follow-up visits as told by your doctor. This is important. Medicines   Take over-the-counter and prescription medicines only as told by your doctor. Follow directions carefully.  Do not skip doses of blood pressure medicine. The medicine does not work as well if you skip doses. Skipping doses also puts you at risk for problems.  Ask your doctor about side effects or reactions to medicines that you should watch for. Contact a doctor if:  You think you are having a reaction to the medicine you are taking.  You have headaches that keep coming back (recurring).  You feel dizzy.  You have swelling in your ankles.  You have trouble with your vision. Get help right away if:  You get a very bad headache.  You start to feel confused.  You feel weak or numb.  You feel faint.  You get very bad pain in your:  Chest.  Belly (abdomen).  You throw up (vomit) more than once.  You have trouble breathing. Summary  Hypertension is another name for high blood pressure.  Making healthy choices can help lower blood pressure. If your blood pressure cannot be controlled with healthy choices, you may need to take  medicine. This information is not intended to replace advice given to you by your health care provider. Make sure you discuss any questions you have with your health care provider. Document Released: 04/27/2008 Document Revised: 10/07/2016 Document Reviewed: 10/07/2016 Elsevier Interactive Patient Education  2017 [place] Maintenance, Male A healthy lifestyle and preventive care is important for your health and wellness. Ask your health care provider about what schedule of regular examinations is right for you. What should I know about weight and diet?  Eat a Healthy Diet  Eat plenty of vegetables, fruits, whole grains, low-fat dairy products, and lean protein.  Do not eat a lot of foods high in solid fats, added sugars, or salt. Maintain a Healthy Weight  Regular exercise can help you achieve or maintain a healthy weight. You should:  Do at least 150 minutes of exercise each week. The exercise should increase your heart rate and make you sweat (moderate-intensity exercise).  Do strength-training exercises at least twice a week. Watch Your Levels of Cholesterol and Blood Lipids  Have your blood tested for lipids and cholesterol every 5 years starting at 52 years of age. If you are at high risk for heart disease, you should start having your blood tested when you are 52 years old. You may need to have your cholesterol levels checked more often if:  Your lipid or cholesterol levels are high.  You are older than 52 years of age.  You are at high risk for heart disease. What should I know about cancer screening? Many types of cancers can be detected early and may often be prevented. Lung Cancer  You should be screened every year for lung cancer if:  You are a current smoker who has smoked for at least 30 years.  You are a former smoker who has quit within the past 15 years.  Talk to your health care provider about your screening options, when you should start  screening, and how often you should be screened. Colorectal Cancer  Routine colorectal cancer screening usually begins at 52 years of age and should be repeated every 5-10 years until you are 52 years old. You may need to be screened more often if early forms of precancerous polyps or small growths are found. Your health care provider may recommend screening at an earlier age if you have risk factors for colon cancer.  Your health care provider may recommend using home test kits to check for hidden blood in the stool.  A small camera at the end of a tube can be used to examine your colon (sigmoidoscopy or colonoscopy). This checks for the earliest forms of colorectal cancer. Prostate and Testicular Cancer  Depending on your age and overall health, your health care provider may do certain tests to screen for prostate and testicular cancer.  Talk to your health care provider about any  symptoms or concerns you have about testicular or prostate cancer. Skin Cancer  Check your skin from head to toe regularly.  Tell your health care provider about any new moles or changes in moles, especially if:  There is a change in a mole's size, shape, or color.  You have a mole that is larger than a pencil eraser.  Always use sunscreen. Apply sunscreen liberally and repeat throughout the day.  Protect yourself by wearing long sleeves, pants, a wide-brimmed hat, and sunglasses when outside. What should I know about heart disease, diabetes, and high blood pressure?  If you are 44-38 years of age, have your blood pressure checked every 3-5 years. If you are 19 years of age or older, have your blood pressure checked every year. You should have your blood pressure measured twice-once when you are at a hospital or clinic, and once when you are not at a hospital or clinic. Record the average of the two measurements. To check your blood pressure when you are not at a hospital or clinic, you can use:  An  automated blood pressure machine at a pharmacy.  A home blood pressure monitor.  Talk to your health care provider about your target blood pressure.  If you are between 18-28 years old, ask your health care provider if you should take aspirin to prevent heart disease.  Have regular diabetes screenings by checking your fasting blood sugar level.  If you are at a normal weight and have a low risk for diabetes, have this test once every three years after the age of 40.  If you are overweight and have a high risk for diabetes, consider being tested at a younger age or more often.  A one-time screening for abdominal aortic aneurysm (AAA) by ultrasound is recommended for men aged 73-75 years who are current or former smokers. What should I know about preventing infection? Hepatitis B  If you have a higher risk for hepatitis B, you should be screened for this virus. Talk with your health care provider to find out if you are at risk for hepatitis B infection. Hepatitis C  Blood testing is recommended for:  Everyone born from [birth year] through 1966.  Anyone with known risk factors for hepatitis C. Sexually Transmitted Diseases (STDs)  You should be screened each year for STDs including gonorrhea and chlamydia if:  You are sexually active and are younger than 52 years of age.  You are older than 52 years of age and your health care provider tells you that you are at risk for this type of infection.  Your sexual activity has changed since you were last screened and you are at an increased risk for chlamydia or gonorrhea. Ask your health care provider if you are at risk.  Talk with your health care provider about whether you are at high risk of being infected with HIV. Your health care provider may recommend a prescription medicine to help prevent HIV infection. What else can I do?  Schedule regular health, dental, and eye exams.  Stay current with your vaccines (immunizations).  Do not use  any tobacco products, such as cigarettes, chewing tobacco, and e-cigarettes. If you need help quitting, ask your health care provider.  Limit alcohol intake to no more than 2 drinks per day. One drink equals 12 ounces of beer, 5 ounces of wine, or 1 ounces of hard liquor.  Do not use street drugs.  Do not share needles.  Ask your health care provider for  help if you need support or information about quitting drugs.  Tell your health care provider if you often feel depressed.  Tell your health care provider if you have ever been abused or do not feel safe at home. This information is not intended to replace advice given to you by your health care provider. Make sure you discuss any questions you have with your health care provider. Document Released: 05/07/2008 Document Revised: 07/08/2016 Document Reviewed: 08/13/2015 Elsevier Interactive Patient Education  2017 [employer].

## 2017-02-10 NOTE — Progress Notes (Addendum)
Patient ID: [name], male  DOB: [birth year], 51 y.o.   MRN: [id number] Patient Care Team    Relationship Specialty Notifications Start End  Ma Hillock, DO PCP - General Family Medicine  02/10/16     Subjective:  [name] is a 51 y.o. male present for CPE. All past medical history, surgical history, allergies, family history, immunizations, medications and social history were updated in the electronic medical record today. All recent labs, ED visits and hospitalizations within the last year were reviewed.  Elevated BP>>HTN: Discussed with pt his BP has been consistently elevated over many visits in different settings over the last 1.5 years. He denies chest pain, shortness of breath or LE edema.   Health maintenance: Updated 02/10/2017 Colonoscopy: 2017, colon resection in 2008 secondary to colon cancer. Mother also had colon cancer. He has a daughter that has had normal screenings.  Immunizations: tdap UTD (2012), declines flu shot  Infectious disease screening:HIV completed today   PSA:  Lab Results  Component Value Date   PSA 0.46 02/10/2016   PSA 1.91 03/22/2007  , pt was counseled on prostate cancer screenings.  Assistive device: none Oxygen IFO:YDXA Patient has a Dental home. Hospitalizations/ED visits: none  Depression screen [facility] 2/9 02/10/2017  Decreased Interest 0  Down, Depressed, Hopeless 0  PHQ - 2 Score 0    Current Exercise Habits: The patient has a physically strenous job, but has no regular exercise apart from work. Exercise limited by: None identified   Immunization History  Administered Date(s) Administered  . Tdap 11/23/2010    Past Medical History:  Diagnosis Date  . Cancer ([place])    colon  . Chicken pox   . Hepatitis B   . Lymphadenitis   . Pneumothorax    No Known Allergies Past Surgical History:  Procedure Laterality Date  . COLON SURGERY     resection   Family History  Problem Relation Age of Onset  . Arthritis  Mother   . Cancer Mother   . COPD Mother   . Heart disease Mother   . Colon cancer Mother 21  . Colon polyps Daughter   . Colon cancer Maternal Uncle   . Esophageal cancer Neg Hx   . Pancreatic cancer Neg Hx   . Prostate cancer Neg Hx   . Rectal cancer Neg Hx   . Stomach cancer Neg Hx   . Colon cancer Cousin    Social History   Social History  . Marital status: Single    Spouse name: N/A  . Number of children: N/A  . Years of education: N/A   Occupational History  . Not on file.   Social History Main Topics  . Smoking status: Former Research scientist (life sciences)  . Smokeless tobacco: Never Used  . Alcohol use No  . Drug use: No  . Sexual activity: Yes    Birth control/ protection: None     Comment: Male partner   Other Topics Concern  . Not on file   Social History Narrative   Single, 1 child.   Self-employed, college education.   Drinks caffeinated beverages, uses herbal remedies.   Wears his seatbelt, exercises greater than 3 times a week.   Smoke detector in the home, firearms in the home in a locked cabinet.   Feels safe in his relationships.   Allergies as of 02/10/2017   No Known Allergies     Medication List       Accurate as of  02/10/17  9:28 AM. Always use your most recent med list.          carbamide peroxide 6.5 % otic solution Commonly known as:  DEBROX Place 5 drops into the left ear 2 (two) times daily. For cerumen impaction, then once every 2 weeks for maintenance.   lisinopril 5 MG tablet Commonly known as:  PRINIVIL,ZESTRIL Take 1 tablet (5 mg total) by mouth daily.        No results found for this or any previous visit (from the past 2160 hour(s)).  No results found.   ROS: 14 pt review of systems performed and negative (unless mentioned in an HPI)  Objective: BP (!) 148/96 (BP Location: Right Arm, Cuff Size: Large)   Pulse 61   Temp 97.9 F (36.6 C)   Resp 20   Ht 5\' 7"  (1.702 m)   Wt 186 lb 12 oz (84.7 kg)   SpO2 98%   BMI 29.25 kg/m    Gen: Afebrile. No acute distress. Nontoxic in appearance, well-developed, well-nourished,  Pleasant caucasian male.  HENT: AT. [place]. Bilateral TM visualized and normal in appearance (after left cerumen removal/ear lavage), normal external auditory canal. MMM, no oral lesions, adequate dentition. Bilateral nares within normal limits. Throat without erythema, ulcerations or exudates. no Cough on exam, no hoarseness on exam. Eyes:Pupils Equal Round Reactive to light, Extraocular movements intact,  Conjunctiva without redness, discharge or icterus. Neck/lymp/endocrine: Supple,no lymphadenopathy, no thyromegaly CV: RRR no murmur, no edema, +2/4 P posterior tibialis pulses. no carotid bruits. No JVD. Chest: CTAB, no wheeze, rhonchi or crackles. normal Respiratory effort. good Air movement. Abd: Soft. flat. NTND. BS present. No  Masses palpated. No hepatosplenomegaly. No rebound tenderness or guarding. Skin: no rashes, purpura or petechiae. Warm and well-perfused. Skin intact. Neuro/Msk:  Normal gait. PERLA. EOMi. Alert. Oriented x3.  Cranial nerves II through XII intact. Muscle strength 5/5 upper/lower extremity. TTP medial tibia, no obvious deformity or injury. DTRs equal bilaterally. Psych: Normal affect, dress and demeanor. Normal speech. Normal thought content and judgment.  Assessment/plan: [name] is a 51 y.o. male present for CPE Encounter for preventive health examination Patient was encouraged to exercise greater than 150 minutes a week. Patient was encouraged to choose a diet filled with fresh fruits and vegetables, and lean meats. AVS provided to patient today for education/recommendation on gender specific health and safety maintenance. HIV competed today.  H/O colon cancer, stage III - routine f/u with GI, next colonoscopy 2022 - COMPLETE METABOLIC PANEL WITH GFR BMI 29.0-29.9,adult - diet an exercise discussed - Hemoglobin A1c - Direct LDL - TSH Diabetes mellitus screening -  Hemoglobin A1c Screening cholesterol level - Direct LDL Prostate cancer screening - PSA Encounter for screening for HIV - HIV antibody Thyroid disorder screening - TSH Impacted cerumen of left ear - cerumen removal left ear today by lavage and instrumentation. - use debrox every 2 weeks to maintain, caused by earbud use in that ear. No QTIPS.  - carbamide peroxide (DEBROX) 6.5 % otic solution; Place 5 drops into the left ear 2 (two) times daily. For cerumen impaction, then once every 2 weeks for maintenance.  Dispense: 15 mL; Refill: 2 Essential hypertension - new diagnosis today. Counseled on medication start, diet and exercise.  - Lisinopril 5 mg QD start  Today - low sodium - CBC w/Diff - COMPLETE METABOLIC PANEL WITH GFR - Urine Microalbumin w/creat. Ratio - F/U 10 days, then if stable every 6 mos Right Leg pain:  -  uncertain etiology, possible unknown injury.  - If not resolved in 4 weeks, will want to image tibia.   Return in about 12 days (around 02/22/2017), or HTN, for Office visit. Yearly for CPE Electronically signed by: Howard Pouch, DO [place]

## 2017-02-11 ENCOUNTER — Telehealth: Payer: Self-pay | Admitting: Family Medicine

## 2017-02-11 LAB — HEMOGLOBIN A1C
HEMOGLOBIN A1C: 5.2 % (ref <5.7)
Mean Plasma Glucose: 103 mg/dL

## 2017-02-11 NOTE — Telephone Encounter (Signed)
Spoke with patient reviewed lab results and instructions. 

## 2017-02-11 NOTE — Telephone Encounter (Signed)
Left message for patient to return call.

## 2017-02-11 NOTE — Telephone Encounter (Signed)
Please call pt: - his labs are normal with the exception of mildly elevate protein in the urine from his high blood pressure. - The medication we started him on will not only help with the BP, but also offers kidney protection. We will recheck this lab in 3-6 months and hopefully will return to baseline.  - The HIV test is not resulted. This takes longer to get back from the lab, but I wanted him to have his other results since I will be away on vacation.

## 2017-02-13 LAB — HIV ANTIBODY (ROUTINE TESTING W REFLEX): HIV: NONREACTIVE

## 2017-02-16 ENCOUNTER — Telehealth: Payer: Self-pay | Admitting: Family Medicine

## 2017-02-16 NOTE — Telephone Encounter (Signed)
Please call pt: - his HIV test is also negative.

## 2017-02-16 NOTE — Telephone Encounter (Signed)
Spoke with patient reviewed lab results. 

## 2017-02-23 ENCOUNTER — Encounter: Payer: Self-pay | Admitting: Family Medicine

## 2017-02-23 ENCOUNTER — Ambulatory Visit: Payer: 59 | Admitting: Family Medicine

## 2018-04-14 ENCOUNTER — Encounter: Payer: 59 | Admitting: Family Medicine

## 2018-04-19 ENCOUNTER — Ambulatory Visit (INDEPENDENT_AMBULATORY_CARE_PROVIDER_SITE_OTHER): Payer: BLUE CROSS/BLUE SHIELD | Admitting: Family Medicine

## 2018-04-19 ENCOUNTER — Encounter: Payer: Self-pay | Admitting: Family Medicine

## 2018-04-19 VITALS — BP 157/89 | HR 68 | Temp 97.9°F | Resp 20 | Ht 67.0 in | Wt 183.0 lb

## 2018-04-19 DIAGNOSIS — Z125 Encounter for screening for malignant neoplasm of prostate: Secondary | ICD-10-CM | POA: Diagnosis not present

## 2018-04-19 DIAGNOSIS — Z85038 Personal history of other malignant neoplasm of large intestine: Secondary | ICD-10-CM | POA: Diagnosis not present

## 2018-04-19 DIAGNOSIS — I10 Essential (primary) hypertension: Secondary | ICD-10-CM

## 2018-04-19 DIAGNOSIS — E663 Overweight: Secondary | ICD-10-CM | POA: Diagnosis not present

## 2018-04-19 DIAGNOSIS — Z0000 Encounter for general adult medical examination without abnormal findings: Secondary | ICD-10-CM

## 2018-04-19 DIAGNOSIS — Z131 Encounter for screening for diabetes mellitus: Secondary | ICD-10-CM | POA: Diagnosis not present

## 2018-04-19 LAB — PSA: PSA: 0.45 ng/mL (ref 0.10–4.00)

## 2018-04-19 LAB — COMPREHENSIVE METABOLIC PANEL
ALT: 21 U/L (ref 0–53)
AST: 21 U/L (ref 0–37)
Albumin: 4 g/dL (ref 3.5–5.2)
Alkaline Phosphatase: 55 U/L (ref 39–117)
BILIRUBIN TOTAL: 0.6 mg/dL (ref 0.2–1.2)
BUN: 18 mg/dL (ref 6–23)
CO2: 28 meq/L (ref 19–32)
CREATININE: 0.97 mg/dL (ref 0.40–1.50)
Calcium: 9.2 mg/dL (ref 8.4–10.5)
Chloride: 105 mEq/L (ref 96–112)
GFR: 86.18 mL/min (ref >60.00)
GLUCOSE: 93 mg/dL (ref 70–99)
Potassium: 4.4 mEq/L (ref 3.5–5.1)
Sodium: 139 mEq/L (ref 135–145)
Total Protein: 7.1 g/dL (ref 6.0–8.3)

## 2018-04-19 LAB — TSH: TSH: 1.55 u[IU]/mL (ref 0.35–4.50)

## 2018-04-19 LAB — CBC WITH DIFFERENTIAL/PLATELET
BASOS ABS: 0.1 10*3/uL (ref 0.0–0.1)
Basophils Relative: 1.4 % (ref 0.0–3.0)
EOS ABS: 0.4 10*3/uL (ref 0.0–0.7)
Eosinophils Relative: 6.9 % — ABNORMAL HIGH (ref 0.0–5.0)
HEMATOCRIT: 43 % (ref 39.0–52.0)
Hemoglobin: 14.7 g/dL (ref 13.0–17.0)
LYMPHS ABS: 1.5 10*3/uL (ref 0.7–4.0)
LYMPHS PCT: 29.3 % (ref 12.0–46.0)
MCHC: 34.1 g/dL (ref 30.0–36.0)
MCV: 87.9 fl (ref 78.0–100.0)
Monocytes Absolute: 0.5 10*3/uL (ref 0.1–1.0)
Monocytes Relative: 9 % (ref 3.0–12.0)
NEUTROS ABS: 2.7 10*3/uL (ref 1.4–7.7)
NEUTROS PCT: 53.4 % (ref 43.0–77.0)
PLATELETS: 203 10*3/uL (ref 150.0–400.0)
RBC: 4.89 Mil/uL (ref 4.22–5.81)
RDW: 13.6 % (ref 11.5–15.5)
WBC: 5.1 10*3/uL (ref 4.0–10.5)

## 2018-04-19 LAB — LIPID PANEL
CHOL/HDL RATIO: 4
Cholesterol: 165 mg/dL (ref 0–200)
HDL: 37 mg/dL — ABNORMAL LOW (ref >39.00)
LDL Cholesterol: 109 mg/dL — ABNORMAL HIGH (ref 0–99)
NONHDL: 128.17
Triglycerides: 96 mg/dL (ref 0.0–149.0)
VLDL: 19.2 mg/dL (ref 0.0–40.0)

## 2018-04-19 LAB — MICROALBUMIN / CREATININE URINE RATIO
CREATININE, U: 104.3 mg/dL
MICROALB/CREAT RATIO: 0.8 mg/g (ref 0.0–30.0)
Microalb, Ur: 0.9 mg/dL (ref 0.0–1.9)

## 2018-04-19 LAB — HEMOGLOBIN A1C: Hgb A1c MFr Bld: 5.6 % (ref 4.6–6.5)

## 2018-04-19 MED ORDER — AMLODIPINE BESYLATE 5 MG PO TABS: 5.0000 mg | ORAL_TABLET | Freq: Every day | ORAL | 3 refills | Status: DC

## 2018-04-19 NOTE — Progress Notes (Signed)
Patient ID: [name], male  DOB: 02/28/1966, 52 y.o.   MRN: [id number] Patient Care Team    Relationship Specialty Notifications Start End  Ma Hillock, DO PCP - General Family Medicine  02/10/16   Doran Stabler, MD Consulting Physician Gastroenterology  04/19/18     Chief Complaint  Patient presents with  . Annual Exam    Subjective:  [name] is a 52 y.o. male present for CPE. All past medical history, surgical history, allergies, family history, immunizations, medications and social history were updated in the electronic medical record today. All recent labs, ED visits and hospitalizations within the last year were reviewed.  Hypertension:  Pt reports he was not compliant with lisinopril 5 mg QD, he states it gave him a headache so he stopped using it about 10 months ago. Blood pressures ranges at home not checked. Patient denies chest pain, shortness of breath or lower extremity edema. BMP: WNL 2018 CBC: WNL 2018 TSH: WNL 2018 Microalb: elevated 2018 Diet: no routine Exercise: no routine RF: HTN, former smoker, Fhx    Health maintenance: 04/19/2018 Colonoscopy: 2017, colon resection in 2008 secondary to colon cancer. 5 yr f/u. Mother also had colon cancer. He has a daughter that has had normal screenings.  Immunizations: tdap UTD (2012), declines flu shot. Shingrix declined Infectious disease screening:HIV completed 01/2017 PSA:  Lab Results  Component Value Date   PSA 0.42 02/10/2017   PSA 0.46 02/10/2016   PSA 1.91 03/22/2007  , pt was counseled on prostate cancer screenings.  Assistive device: None Oxygen use: None Patient has a Dental home. Hospitalizations/ED visits: None  Depression screen [facility] 2/9 04/19/2018 02/10/2017  Decreased Interest 0 0  Down, Depressed, Hopeless 0 0  PHQ - 2 Score 0 0   No flowsheet data found.   Current Exercise Habits: The patient has a physically strenous job, but has no regular exercise apart from  work. Exercise limited by: None identified Fall Risk  04/19/2018 02/10/2017  Falls in the past year? No No      Immunization History  Administered Date(s) Administered  . Tdap 11/23/2010     Past Medical History:  Diagnosis Date  . Chicken pox   . Colon cancer ([place])    colon  . Hepatitis B   . Hypertension   . Lymphadenitis   . Pneumothorax    Allergies  Allergen Reactions  . Lisinopril Other (See Comments)    headache   Past Surgical History:  Procedure Laterality Date  . COLON SURGERY     resection   Family History  Problem Relation Age of Onset  . Arthritis Mother   . Cancer Mother   . COPD Mother   . Heart disease Mother   . Colon cancer Mother 67  . Colon polyps Daughter   . Colon cancer Maternal Uncle   . Colon cancer Cousin   . Esophageal cancer Neg Hx   . Pancreatic cancer Neg Hx   . Prostate cancer Neg Hx   . Rectal cancer Neg Hx   . Stomach cancer Neg Hx    Social History   Socioeconomic History  . Marital status: Single    Spouse name: Not on file  . Number of children: Not on file  . Years of education: Not on file  . Highest education level: Not on file  Occupational History  . Not on file  Social Needs  . Financial resource strain: Not on file  .  Food insecurity:    Worry: Not on file    Inability: Not on file  . Transportation needs:    Medical: Not on file    Non-medical: Not on file  Tobacco Use  . Smoking status: Former Research scientist (life sciences)  . Smokeless tobacco: Never Used  Substance and Sexual Activity  . Alcohol use: No    Alcohol/week: 0.0 oz  . Drug use: No  . Sexual activity: Yes    Birth control/protection: None    Comment: Male partner  Lifestyle  . Physical activity:    Days per week: Not on file    Minutes per session: Not on file  . Stress: Not on file  Relationships  . Social connections:    Talks on phone: Not on file    Gets together: Not on file    Attends religious service: Not on file    Active member of club  or organization: Not on file    Attends meetings of clubs or organizations: Not on file    Relationship status: Not on file  . Intimate partner violence:    Fear of current or ex partner: Not on file    Emotionally abused: Not on file    Physically abused: Not on file    Forced sexual activity: Not on file  Other Topics Concern  . Not on file  Social History Narrative   Single, 1 child.   Self-employed, college education.   Drinks caffeinated beverages, uses herbal remedies.   Wears his seatbelt, exercises greater than 3 times a week.   Smoke detector in the home, firearms in the home in a locked cabinet.   Feels safe in his relationships.   Allergies as of 04/19/2018      Reactions   Lisinopril Other (See Comments)   headache      Medication List        Accurate as of 04/19/18  9:38 AM. Always use your most recent med list.          amLODipine 5 MG tablet Commonly known as:  NORVASC Take 1 tablet (5 mg total) by mouth daily.      All past medical history, surgical history, allergies, family history, immunizations andmedications were updated in the EMR today and reviewed under the history and medication portions of their EMR.     No results found for this or any previous visit (from the past 2160 hour(s)).  No results found.   ROS: 14 pt review of systems performed and negative (unless mentioned in an HPI)  Objective: BP (!) 157/89 (BP Location: Left Arm, Patient Position: Sitting, Cuff Size: Large)   Pulse 68   Temp 97.9 F (36.6 C)   Resp 20   Ht '5\' 7"'  (1.702 m)   Wt 183 lb (83 kg)   SpO2 98%   BMI 28.66 kg/m  Gen: Afebrile. No acute distress. Nontoxic in appearance, well-developed, well-nourished,  pleasant caucasian male. Overweight.  HENT: AT. [place]. Bilateral TM visualized and normal in appearance, normal external auditory canal. MMM, no oral lesions, adequate dentition. Bilateral nares within normal limits. Throat without erythema, ulcerations or exudates.  no Cough on exam, no hoarseness on exam. Eyes:Pupils Equal Round Reactive to light, Extraocular movements intact,  Conjunctiva without redness, discharge or icterus. Neck/lymp/endocrine: Supple,no lymphadenopathy, no thyromegaly CV: RRR no murmur, no edema, +2/4 P posterior tibialis pulses. no carotid bruits. No JVD. Chest: CTAB, no wheeze, rhonchi or crackles. normal Respiratory effort. good Air movement. Abd: Soft. flat. NTND.  BS present. no Masses palpated. No hepatosplenomegaly. No rebound tenderness or guarding. Skin: no rashes, purpura or petechiae. Warm and well-perfused. Skin intact. Neuro/Msk:  Normal gait. PERLA. EOMi. Alert. Oriented x3.  Cranial nerves II through XII intact. Muscle strength 5/5 upper/lower extremity. DTRs equal bilaterally. Psych: Normal affect, dress and demeanor. Normal speech. Normal thought content and judgment.  No exam data present  Assessment/plan: [name] is a 52 y.o. male present for CPE Diabetes mellitus screening - HgB A1c Prostate cancer screening - PSA Essential hypertension/Overweight (BMI 25.0-29.9) - non-compliance is an issue. He stopped after only a month of lisinopril, 2/2 to developing a headache. He did not follow up to discuss. He did have protein in his urine last year. Discussed normal BP and MI/Stroke?organ damage potential with uncontrolled HTN - low sodium, continue to exercise.  - Start amlodipine 5 mg QD - CBC w/Diff - Comp Met (CMET) - Lipid panel - TSH - Urine Microalbumin w/creat. Ratio - f/u 3 months  H/O colon cancer, stage III Due 2023 for follow up  Encounter for preventive health examination Patient was encouraged to exercise greater than 150 minutes a week. Patient was encouraged to choose a diet filled with fresh fruits and vegetables, and lean meats. AVS provided to patient today for education/recommendation on gender specific health and safety maintenance. Colonoscopy: 2017, colon resection in 2008  secondary to colon cancer. 5 yr f/u. Mother also had colon cancer. He has a daughter that has had normal screenings.  Immunizations: tdap UTD (2012), declines flu shot. Shingrix declined Infectious disease screening:HIV completed 01/2017  Return in about 1 year (around 04/20/2019) for CPE. 3 mos for HTN Note is dictated utilizing voice recognition software. Although note has been proof read prior to signing, occasional typographical errors still can be missed. If any questions arise, please do not hesitate to call for verification.  Electronically signed by: Howard Pouch, DO [place]

## 2018-04-19 NOTE — Patient Instructions (Signed)

## 2018-05-23 DIAGNOSIS — J209 Acute bronchitis, unspecified: Secondary | ICD-10-CM | POA: Diagnosis not present

## 2018-05-23 DIAGNOSIS — J019 Acute sinusitis, unspecified: Secondary | ICD-10-CM | POA: Diagnosis not present

## 2018-07-20 ENCOUNTER — Encounter: Payer: Self-pay | Admitting: *Deleted

## 2018-07-20 ENCOUNTER — Ambulatory Visit: Payer: BLUE CROSS/BLUE SHIELD | Admitting: Family Medicine

## 2018-07-20 DIAGNOSIS — Z0289 Encounter for other administrative examinations: Secondary | ICD-10-CM

## 2019-04-21 ENCOUNTER — Encounter: Payer: BLUE CROSS/BLUE SHIELD | Admitting: Family Medicine

## 2019-05-01 ENCOUNTER — Encounter: Payer: Self-pay | Admitting: Family Medicine

## 2019-05-02 ENCOUNTER — Encounter: Payer: Self-pay | Admitting: Family Medicine

## 2019-05-02 ENCOUNTER — Ambulatory Visit (INDEPENDENT_AMBULATORY_CARE_PROVIDER_SITE_OTHER): Payer: BC Managed Care – PPO | Admitting: Family Medicine

## 2019-05-02 ENCOUNTER — Other Ambulatory Visit: Payer: Self-pay

## 2019-05-02 VITALS — BP 146/98 | HR 64 | Temp 97.5°F | Resp 17 | Ht 67.75 in | Wt 181.4 lb

## 2019-05-02 DIAGNOSIS — Z23 Encounter for immunization: Secondary | ICD-10-CM | POA: Diagnosis not present

## 2019-05-02 DIAGNOSIS — Z0001 Encounter for general adult medical examination with abnormal findings: Secondary | ICD-10-CM | POA: Diagnosis not present

## 2019-05-02 DIAGNOSIS — Z131 Encounter for screening for diabetes mellitus: Secondary | ICD-10-CM

## 2019-05-02 DIAGNOSIS — S30861A Insect bite (nonvenomous) of abdominal wall, initial encounter: Secondary | ICD-10-CM

## 2019-05-02 DIAGNOSIS — E663 Overweight: Secondary | ICD-10-CM

## 2019-05-02 DIAGNOSIS — Z125 Encounter for screening for malignant neoplasm of prostate: Secondary | ICD-10-CM | POA: Diagnosis not present

## 2019-05-02 DIAGNOSIS — I10 Essential (primary) hypertension: Secondary | ICD-10-CM | POA: Diagnosis not present

## 2019-05-02 DIAGNOSIS — M2550 Pain in unspecified joint: Secondary | ICD-10-CM | POA: Diagnosis not present

## 2019-05-02 DIAGNOSIS — W57XXXA Bitten or stung by nonvenomous insect and other nonvenomous arthropods, initial encounter: Secondary | ICD-10-CM

## 2019-05-02 LAB — CBC
HCT: 42.8 % (ref 39.0–52.0)
Hemoglobin: 14.6 g/dL (ref 13.0–17.0)
MCHC: 34 g/dL (ref 30.0–36.0)
MCV: 87.7 fl (ref 78.0–100.0)
Platelets: 217 10*3/uL (ref 150.0–400.0)
RBC: 4.88 Mil/uL (ref 4.22–5.81)
RDW: 13.8 % (ref 11.5–15.5)
WBC: 6.7 10*3/uL (ref 4.0–10.5)

## 2019-05-02 LAB — LIPID PANEL
Cholesterol: 180 mg/dL (ref 0–200)
HDL: 38.5 mg/dL — ABNORMAL LOW (ref >39.00)
LDL Cholesterol: 119 mg/dL — ABNORMAL HIGH (ref 0–99)
NonHDL: 141.65
Total CHOL/HDL Ratio: 5
Triglycerides: 111 mg/dL (ref 0.0–149.0)
VLDL: 22.2 mg/dL (ref 0.0–40.0)

## 2019-05-02 LAB — COMPREHENSIVE METABOLIC PANEL
ALT: 21 U/L (ref 0–53)
AST: 22 U/L (ref 0–37)
Albumin: 4.1 g/dL (ref 3.5–5.2)
Alkaline Phosphatase: 61 U/L (ref 39–117)
BUN: 15 mg/dL (ref 6–23)
CO2: 29 mEq/L (ref 19–32)
Calcium: 9.1 mg/dL (ref 8.4–10.5)
Chloride: 102 mEq/L (ref 96–112)
Creatinine, Ser: 0.99 mg/dL (ref 0.40–1.50)
GFR: 78.88 mL/min (ref >60.00)
Glucose, Bld: 87 mg/dL (ref 70–99)
Potassium: 3.8 mEq/L (ref 3.5–5.1)
Sodium: 138 mEq/L (ref 135–145)
Total Bilirubin: 0.6 mg/dL (ref 0.2–1.2)
Total Protein: 6.8 g/dL (ref 6.0–8.3)

## 2019-05-02 LAB — HEMOGLOBIN A1C: Hgb A1c MFr Bld: 5.7 % (ref 4.6–6.5)

## 2019-05-02 LAB — PSA: PSA: 0.36 ng/mL (ref 0.10–4.00)

## 2019-05-02 LAB — TSH: TSH: 0.93 u[IU]/mL (ref 0.35–4.50)

## 2019-05-02 MED ORDER — LOSARTAN POTASSIUM 50 MG PO TABS: 50.0000 mg | ORAL_TABLET | Freq: Every day | ORAL | 3 refills | Status: DC

## 2019-05-02 NOTE — Progress Notes (Signed)
Patient ID: [name], male  DOB: [birth year], 53 y.o.   MRN: [id number] Patient Care Team    Relationship Specialty Notifications Start End  Ma Hillock, DO PCP - General Family Medicine  02/10/16   Doran Stabler, MD Consulting Physician Gastroenterology  04/19/18     Chief Complaint  Patient presents with  . Annual Exam    Pt is doing well with no complaints     Subjective:  [name] is a 53 y.o. male present for CPE. All past medical history, surgical history, allergies, family history, immunizations, medications and social history were updated in the electronic medical record today. All recent labs, ED visits and hospitalizations within the last year were reviewed.  Pt complains of new intermittent headache and polyarthralgia over the last 2 weeks. He reports having many ticks on him this year. One bite he has concerns about was about 3-4 weeks ago on his abdomen. He reports it is still red and itchy. He denies rash.   Hypertension:  Pt reports he has not been taking his amlodipine 5 mg because it caused him stomach upset. He did not present to discuss. In the past he stopped lisinopril in the past because it caused him a  Headache. Patient denies chest pain, shortness of breath, dizziness or lower extremity edema.  BMP: WNL 03/2018 CBC: WNL 03/2018 TSH: WNL 03/2018 Microalb: elevated 2018 Diet: no routine Exercise: no routine RF: HTN, former smoker, Fhx  Health maintenance:  Colonoscopy: 2017, colon resection in 2008 secondary to colon cancer. 5 yr f/u. Mother also had colon cancer. He has a daughter that has had normal screenings. Immunizations: tdap UTD (2012), declines flu shot in the past. Shingrix #1 today. Infectious disease screening:HIVcompleted 01/2017 PSA:  Lab Results  Component Value Date   PSA 0.45 04/19/2018   PSA 0.42 02/10/2017   PSA 0.46 02/10/2016  , pt was counseled on prostate cancer screenings.  Assistive device: none Oxygen  WUJ:WJXB Patient has a Dental home. Hospitalizations/ED visits: reviewed  Depression screen [facility] 2/9 05/02/2019 04/19/2018 02/10/2017  Decreased Interest 0 0 0  Down, Depressed, Hopeless 0 0 0  PHQ - 2 Score 0 0 0   No flowsheet data found.     Fall Risk  04/19/2018 02/10/2017  Falls in the past year? No No   Immunization History  Administered Date(s) Administered  . Tdap 11/23/2010     Past Medical History:  Diagnosis Date  . Chicken pox   . Colon cancer ([place])    colon  . Hepatitis B   . Hypertension   . Lymphadenitis   . Pneumothorax    Allergies  Allergen Reactions  . Amlodipine Nausea And Vomiting    Stomach upset   . Lisinopril Other (See Comments)    headache   Past Surgical History:  Procedure Laterality Date  . COLON SURGERY     resection   Family History  Problem Relation Age of Onset  . Arthritis Mother   . Cancer Mother   . COPD Mother   . Heart disease Mother   . Colon cancer Mother 56  . Colon polyps Daughter   . Colon cancer Maternal Uncle   . Colon cancer Cousin   . Esophageal cancer Neg Hx   . Pancreatic cancer Neg Hx   . Prostate cancer Neg Hx   . Rectal cancer Neg Hx   . Stomach cancer Neg Hx    Social History   Social  History Narrative   Single, 1 child.   Self-employed, college education.   Drinks caffeinated beverages, uses herbal remedies.   Wears his seatbelt, exercises greater than 3 times a week.   Smoke detector in the home, firearms in the home in a locked cabinet.   Feels safe in his relationships.    Allergies as of 05/02/2019      Reactions   Amlodipine Nausea And Vomiting   Stomach upset   Lisinopril Other (See Comments)   headache      Medication List       Accurate as of May 02, 2019 11:40 AM. If you have any questions, ask your nurse or doctor.        STOP taking these medications   amLODipine 5 MG tablet Commonly known as:  NORVASC Stopped by:  Howard Pouch, DO     TAKE these medications   losartan  50 MG tablet Commonly known as:  COZAAR Take 1 tablet (50 mg total) by mouth daily. Started by:  Howard Pouch, DO      All past medical history, surgical history, allergies, family history, immunizations andmedications were updated in the EMR today and reviewed under the history and medication portions of their EMR.     No results found for this or any previous visit (from the past 2160 hour(s)).  No results found.   ROS: 14 pt review of systems performed and negative (unless mentioned in an HPI)  Objective: BP (!) 146/98 (BP Location: Right Arm, Patient Position: Sitting, Cuff Size: Normal)   Pulse 64   Temp (!) 97.5 F (36.4 C) (Oral)   Resp 17   Ht 5' 7.75" (1.721 m)   Wt 181 lb 6 oz (82.3 kg)   SpO2 98%   BMI 27.78 kg/m  Gen: Afebrile. No acute distress. Nontoxic in appearance, well-developed, well-nourished,  Pleasant, mildly overweight caucasian male.  HENT: AT. [place]. Bilateral TM visualized and normal in appearance, normal external auditory canal. MMM, no oral lesions, adequate dentition. Bilateral nares within normal limits. Throat without erythema, ulcerations or exudates. no Cough on exam, no hoarseness on exam. Eyes:Pupils Equal Round Reactive to light, Extraocular movements intact,  Conjunctiva without redness, discharge or icterus. Neck/lymp/endocrine: Supple,no lymphadenopathy, no thyromegaly CV: RRR no murmur, no edema, +2/4 P posterior tibialis pulses. no carotid bruits. No JVD. Chest: CTAB, no wheeze, rhonchi or crackles. normal Respiratory effort. good Air movement. Abd: Soft. flat. NTND. BS present. no Masses palpated. No hepatosplenomegaly. No rebound tenderness or guarding. Skin: no rashes, purpura or petechiae. Warm and well-perfused. Skin intact. X1 small red insect bite mid-abdomen. . Neuro/Msk:  Normal gait. PERLA. EOMi. Alert. Oriented x3.  Cranial nerves II through XII intact. Muscle strength 5/5 upper/lower extremity. DTRs equal bilaterally. Psych: Normal  affect, dress and demeanor. Normal speech. Normal thought content and judgment.  No exam data present  Assessment/plan: [name] is a 53 y.o. male present for CPE Encounter for general adult medical examination with abnormal findings Patient was encouraged to exercise greater than 150 minutes a week. Patient was encouraged to choose a diet filled with fresh fruits and vegetables, and lean meats. AVS provided to patient today for education/recommendation on gender specific health and safety maintenance. Colonoscopy: 5 yr rpt due 2022.  Immunizations: tdap UTD (2012), declines flu shot in the past. Shingrix #1 today. Infectious disease screening:HIVcompleted 01/2017 Patient was encouraged to exercise greater than 150 minutes a week. Patient was encouraged to choose a diet filled with fresh fruits  and vegetables, and lean meats. AVS provided to patient today for education/recommendation on gender specific health and safety maintenance.  Essential hypertension/Overweight (BMI 25.0-29.9) - noncompliance is an issue. Discussed the importance of taking his BP medication daily. If he has problems with one- he is encouraged to communicate that with [place] so we can provide him with other options. Discussed potential consequences of uncontrolled HTN on the body. Pt agreed to start new med losartan. - CBC - Comprehensive metabolic panel - Lipid panel - TSH - losartan (COZAAR) 50 MG tablet; Take 1 tablet (50 mg total) by mouth daily.  Dispense: 90 tablet; Refill: 3 - nurse visit 3  Mos for recheck (with shingrix #2) Polyarthralgia/ick bite of abdomen, initial encounter - B. burgdorfi antibodies - will treat if appropriate after results. No current rash, fever or headache.  - consider OA as potential cause as well. Prostate cancer screening - PSA Diabetes mellitus screening - Hemoglobin A1c   Return in about 1 year (around 05/01/2020). 6 mos HTN 3 mos nurse visit for shingrix #2 and BP recheck  by nurse.    CPE plus > 15 minutes spent with patient, > 50% of that time face to face  Note is dictated utilizing voice recognition software. Although note has been proof read prior to signing, occasional typographical errors still can be missed. If any questions arise, please do not hesitate to call for verification.   Electronically signed by: Howard Pouch, DO [place]

## 2019-05-02 NOTE — Addendum Note (Signed)
Addended by: Caroll Rancher L on: 05/02/2019 12:52 PM   Modules accepted: Orders

## 2019-05-02 NOTE — Patient Instructions (Addendum)
Start losartan daily for blood pressure.  Shingles shot #1 provided today. >>> 3 mos nurse visit for shingrix #2 and BP recheck.     Health Maintenance, Male A healthy lifestyle and preventive care is important for your health and wellness. Ask your health care provider about what schedule of regular examinations is right for you. What should I know about weight and diet? Eat a Healthy Diet  Eat plenty of vegetables, fruits, whole grains, low-fat dairy products, and lean protein.  Do not eat a lot of foods high in solid fats, added sugars, or salt.  Maintain a Healthy Weight Regular exercise can help you achieve or maintain a healthy weight. You should:  Do at least 150 minutes of exercise each week. The exercise should increase your heart rate and make you sweat (moderate-intensity exercise).  Do strength-training exercises at least twice a week. Watch Your Levels of Cholesterol and Blood Lipids  Have your blood tested for lipids and cholesterol every 5 years starting at 54 years of age. If you are at high risk for heart disease, you should start having your blood tested when you are 54 years old. You may need to have your cholesterol levels checked more often if: ? Your lipid or cholesterol levels are high. ? You are older than 54 years of age. ? You are at high risk for heart disease. What should I know about cancer screening? Many types of cancers can be detected early and may often be prevented. Lung Cancer  You should be screened every year for lung cancer if: ? You are a current smoker who has smoked for at least 30 years. ? You are a former smoker who has quit within the past 15 years.  Talk to your health care provider about your screening options, when you should start screening, and how often you should be screened. Colorectal Cancer  Routine colorectal cancer screening usually begins at 54 years of age and should be repeated every 5-10 years until you are 54 years  old. You may need to be screened more often if early forms of precancerous polyps or small growths are found. Your health care provider may recommend screening at an earlier age if you have risk factors for colon cancer.  Your health care provider may recommend using home test kits to check for hidden blood in the stool.  A small camera at the end of a tube can be used to examine your colon (sigmoidoscopy or colonoscopy). This checks for the earliest forms of colorectal cancer. Prostate and Testicular Cancer  Depending on your age and overall health, your health care provider may do certain tests to screen for prostate and testicular cancer.  Talk to your health care provider about any symptoms or concerns you have about testicular or prostate cancer. Skin Cancer  Check your skin from head to toe regularly.  Tell your health care provider about any new moles or changes in moles, especially if: ? There is a change in a mole's size, shape, or color. ? You have a mole that is larger than a pencil eraser.  Always use sunscreen. Apply sunscreen liberally and repeat throughout the day.  Protect yourself by wearing long sleeves, pants, a wide-brimmed hat, and sunglasses when outside. What should I know about heart disease, diabetes, and high blood pressure?  If you are 63-85 years of age, have your blood pressure checked every 3-5 years. If you are 49 years of age or older, have your blood pressure checked  every year. You should have your blood pressure measured twice-once when you are at a hospital or clinic, and once when you are not at a hospital or clinic. Record the average of the two measurements. To check your blood pressure when you are not at a hospital or clinic, you can use: ? An automated blood pressure machine at a pharmacy. ? A home blood pressure monitor.  Talk to your health care provider about your target blood pressure.  If you are between 71-34 years old, ask your health care  provider if you should take aspirin to prevent heart disease.  Have regular diabetes screenings by checking your fasting blood sugar level. ? If you are at a normal weight and have a low risk for diabetes, have this test once every three years after the age of 58. ? If you are overweight and have a high risk for diabetes, consider being tested at a younger age or more often.  A one-time screening for abdominal aortic aneurysm (AAA) by ultrasound is recommended for men aged 33-75 years who are current or former smokers. What should I know about preventing infection? Hepatitis B If you have a higher risk for hepatitis B, you should be screened for this virus. Talk with your health care provider to find out if you are at risk for hepatitis B infection. Hepatitis C Blood testing is recommended for:  Everyone born from [birth year] through 1966.  Anyone with known risk factors for hepatitis C. Sexually Transmitted Diseases (STDs)  You should be screened each year for STDs including gonorrhea and chlamydia if: ? You are sexually active and are younger than 54 years of age. ? You are older than 54 years of age and your health care provider tells you that you are at risk for this type of infection. ? Your sexual activity has changed since you were last screened and you are at an increased risk for chlamydia or gonorrhea. Ask your health care provider if you are at risk.  Talk with your health care provider about whether you are at high risk of being infected with HIV. Your health care provider may recommend a prescription medicine to help prevent HIV infection. What else can I do?  Schedule regular health, dental, and eye exams.  Stay current with your vaccines (immunizations).  Do not use any tobacco products, such as cigarettes, chewing tobacco, and e-cigarettes. If you need help quitting, ask your health care provider.  Limit alcohol intake to no more than 2 drinks per day. One drink equals 12  ounces of beer, 5 ounces of wine, or 1 ounces of hard liquor.  Do not use street drugs.  Do not share needles.  Ask your health care provider for help if you need support or information about quitting drugs.  Tell your health care provider if you often feel depressed.  Tell your health care provider if you have ever been abused or do not feel safe at home. This information is not intended to replace advice given to you by your health care provider. Make sure you discuss any questions you have with your health care provider. Document Released: 05/07/2008 Document Revised: 07/08/2016 Document Reviewed: 08/13/2015 Elsevier Interactive Patient Education  2019 [employer].

## 2019-05-03 ENCOUNTER — Encounter: Payer: Self-pay | Admitting: Family Medicine

## 2019-05-03 ENCOUNTER — Telehealth: Payer: Self-pay | Admitting: Family Medicine

## 2019-05-03 DIAGNOSIS — R509 Fever, unspecified: Secondary | ICD-10-CM | POA: Diagnosis not present

## 2019-05-03 LAB — B. BURGDORFI ANTIBODIES: B burgdorferi Ab IgG+IgM: <0.9 index

## 2019-05-03 NOTE — Telephone Encounter (Signed)
Gave patient lab results. Stated verbal understanding. He will get the fish oil and start taking nightly.   Appointment made with provider in 6 months.

## 2019-05-03 NOTE — Telephone Encounter (Signed)
Please inform patient the following information: His lab are all normal.  - He could benefit from adding a omega 3 (fish oil supplement) 1000 mg before bed. His overall cholesterol was good, but his "good" - HDL cholesterol was 38.5. In men, it is heart protective for the HDL to be about 50 and adding a fish oil nightly will help.  - Nurse follow up 3 mos for BP check and shingrix #2 was scheduled for him yesterday.  - provider appt in 6 mos. For HTN and lab check (BMP- since his BP was too high and we started new med- not fasting)

## 2019-05-31 ENCOUNTER — Encounter: Payer: BLUE CROSS/BLUE SHIELD | Admitting: Family Medicine

## 2019-08-07 ENCOUNTER — Other Ambulatory Visit: Payer: Self-pay

## 2019-08-07 ENCOUNTER — Ambulatory Visit (INDEPENDENT_AMBULATORY_CARE_PROVIDER_SITE_OTHER): Payer: BC Managed Care – PPO | Admitting: Family Medicine

## 2019-08-07 VITALS — BP 144/92 | HR 65

## 2019-08-07 DIAGNOSIS — Z23 Encounter for immunization: Secondary | ICD-10-CM

## 2019-08-07 DIAGNOSIS — I10 Essential (primary) hypertension: Secondary | ICD-10-CM

## 2019-08-07 NOTE — Progress Notes (Addendum)
[name] is a 53 y.o. male presents to the office today for Blood pressure recheck secondary to starting losartan 05/02/2019. Blood pressure medication: losartan 50mg   If on medication, Last dose was at least 1-2 hours prior to recheck: Yes Blood pressure was taken in the left arm after patient rested for 10 minutes.  BP (!) 144/92 (BP Location: Left Arm, Patient Position: Sitting, Cuff Size: Large)   Pulse 65   SpO2 98%   Lisa A.,CMA ______________________________________________________________ Please inform patient the following information: Increase losartan to 100 mg QD. He can finish his current bottle by taking 2 of the losartan 50 mg Qd- the new bottle/prescription will be 100mg  per tab> then only take one.    F/u with provider in 3 mos. Please schedule  Electronically Signed by: Howard Pouch, DO [facility] [place]    Upon contacting patient.  He said that he was actually only taking 25mg .  ( I made him read his bottle and it said 25 mg taking two tabs QD but he was only taking one tab daily)    Please advise if you want him to begin actually taking the 50 mg now?   _____________________________________________  He is to start losartan 50 mg QD. Finish what he has by taking 2 of the 25 mg tabs. The new bottle will be losartan 50 mg per tab>> Once he gets the new bottle take 1 tab to equal 50 mg QD.  Rebecca: please call the pharmacy and tell them to cancel ALL losartan scripts except the 50 mg tab QD.  Thanks. 3 mos follow up.

## 2019-08-08 ENCOUNTER — Encounter: Payer: Self-pay | Admitting: Family Medicine

## 2019-08-08 ENCOUNTER — Ambulatory Visit: Payer: Self-pay | Admitting: *Deleted

## 2019-08-08 MED ORDER — LOSARTAN POTASSIUM 100 MG PO TABS: 100.0000 mg | ORAL_TABLET | Freq: Every day | ORAL | 1 refills | Status: DC

## 2019-08-08 MED ORDER — LOSARTAN POTASSIUM 50 MG PO TABS: 50.0000 mg | ORAL_TABLET | Freq: Every day | ORAL | 1 refills | Status: DC

## 2019-08-08 NOTE — Addendum Note (Signed)
Addended by: Howard Pouch A on: 08/08/2019 12:35 PM   Modules accepted: Orders

## 2019-08-08 NOTE — Progress Notes (Signed)
Sent patient a my chart message. Tried to call patient again it went straight to VM.

## 2019-08-08 NOTE — Addendum Note (Signed)
Addended by: Howard Pouch A on: 08/08/2019 07:44 AM   Modules accepted: Orders

## 2019-08-08 NOTE — Telephone Encounter (Signed)
Had shingles vaccine yesterday. 5 hours later noted chills with 99.6 oral temperature and fatigue. Remains this morning. Denies all other symptoms/has not been exposed to covid that he is aware of/no travels. Reviewed care advice including stay home today monitor symptoms, increase fluids return call if any new symptoms develop or he worsens.    Reason for Disposition . Shingles (Herpes zoster; Shingrix) vaccine reactions  Answer Assessment - Initial Assessment Questions 1. SYMPTOMS: "What is the main symptom?" (e.g., redness, swelling, pain)      Low grade fever 2. ONSET: "When was the vaccine (shot) given?" "How much later did the  begin?" (e.g., hours, days ago)      9 am yesterday had the vaccine and around 2 pm noticed temperature elevated. 3. SEVERITY: "How bad is it?"      mild 4. FEVER: "Is there a fever?" If so, ask: "What is it, how was it measured, and when did it start?"      99.6-99.8 oral 5. IMMUNIZATIONS GIVEN: "What shots have you recently received?"     shringrex 6. PAST REACTIONS: "Have you reacted to immunizations before?" If so, ask: "What happened?"     no 7. OTHER SYMPTOMS: "Do you have any other symptoms?"     fatigue  Protocols used: IMMUNIZATION REACTIONS-A-AH

## 2019-08-08 NOTE — Progress Notes (Signed)
Pt was called and message was left to return call for instructions.

## 2019-08-09 NOTE — Progress Notes (Signed)
RX's cancelled, pt read my chart message and replied

## 2019-10-28 ENCOUNTER — Emergency Department (HOSPITAL_COMMUNITY)
Admission: EM | Admit: 2019-10-28 | Discharge: 2019-10-28 | Disposition: A | Discharge status: Home / Self Care | Payer: BC Managed Care – PPO | Attending: Emergency Medicine | Admitting: Emergency Medicine

## 2019-10-28 ENCOUNTER — Other Ambulatory Visit: Payer: Self-pay

## 2019-10-28 ENCOUNTER — Encounter (HOSPITAL_COMMUNITY): Payer: Self-pay

## 2019-10-28 DIAGNOSIS — R109 Unspecified abdominal pain: Secondary | ICD-10-CM | POA: Insufficient documentation

## 2019-10-28 DIAGNOSIS — N200 Calculus of kidney: Secondary | ICD-10-CM | POA: Diagnosis not present

## 2019-10-28 DIAGNOSIS — Z888 Allergy status to other drugs, medicaments and biological substances status: Secondary | ICD-10-CM | POA: Diagnosis not present

## 2019-10-28 DIAGNOSIS — Z79899 Other long term (current) drug therapy: Secondary | ICD-10-CM | POA: Diagnosis not present

## 2019-10-28 DIAGNOSIS — R1084 Generalized abdominal pain: Secondary | ICD-10-CM | POA: Diagnosis not present

## 2019-10-28 DIAGNOSIS — N400 Enlarged prostate without lower urinary tract symptoms: Secondary | ICD-10-CM | POA: Diagnosis not present

## 2019-10-28 DIAGNOSIS — M549 Dorsalgia, unspecified: Secondary | ICD-10-CM | POA: Diagnosis not present

## 2019-10-28 DIAGNOSIS — K7689 Other specified diseases of liver: Secondary | ICD-10-CM | POA: Diagnosis not present

## 2019-10-28 DIAGNOSIS — Z5321 Procedure and treatment not carried out due to patient leaving prior to being seen by health care provider: Secondary | ICD-10-CM | POA: Insufficient documentation

## 2019-10-28 DIAGNOSIS — I10 Essential (primary) hypertension: Secondary | ICD-10-CM | POA: Diagnosis not present

## 2019-10-28 DIAGNOSIS — N2889 Other specified disorders of kidney and ureter: Secondary | ICD-10-CM | POA: Diagnosis not present

## 2019-10-28 DIAGNOSIS — R1011 Right upper quadrant pain: Secondary | ICD-10-CM | POA: Diagnosis not present

## 2019-10-28 DIAGNOSIS — R319 Hematuria, unspecified: Secondary | ICD-10-CM | POA: Diagnosis not present

## 2019-10-28 DIAGNOSIS — N132 Hydronephrosis with renal and ureteral calculous obstruction: Secondary | ICD-10-CM | POA: Diagnosis not present

## 2019-10-28 LAB — URINALYSIS, ROUTINE W REFLEX MICROSCOPIC
Bilirubin Urine: NEGATIVE
Glucose, UA: NEGATIVE mg/dL
Ketones, ur: NEGATIVE mg/dL
Leukocytes,Ua: NEGATIVE
Nitrite: NEGATIVE
Specific Gravity, Urine: 1.025 (ref 1.005–1.030)
pH: 6 (ref 5.0–8.0)

## 2019-10-28 LAB — URINALYSIS, MICROSCOPIC (REFLEX)
Bacteria, UA: NONE SEEN
RBC / HPF: >50 RBC/hpf (ref 0–5)
Squamous Epithelial / HPF: NONE SEEN (ref 0–5)
WBC, UA: NONE SEEN WBC/hpf (ref 0–5)

## 2019-10-28 NOTE — ED Triage Notes (Signed)
Pt c/o right sided flank pain . Pt denies any urinary issues.

## 2019-10-28 NOTE — ED Notes (Signed)
Called for pt in waiting room, no answer

## 2019-10-28 NOTE — ED Notes (Signed)
Called for pt x 3 times.

## 2019-10-30 ENCOUNTER — Encounter: Payer: Self-pay | Admitting: Family Medicine

## 2019-10-30 ENCOUNTER — Ambulatory Visit: Payer: BC Managed Care – PPO | Admitting: Family Medicine

## 2019-10-31 NOTE — Telephone Encounter (Signed)
Pt states he was dx with kidney stone on 10/28/2019 and the MD at the ED said it should pass by itself and is small enough. Pt stating he is in pain and it still has not passed. Please advise if patient needs to go to ED or make appt

## 2019-10-31 NOTE — Telephone Encounter (Signed)
Was seen in ED on 10/28/19 Still has not passed kidney stone. Patient is in pain. Needs to know what to do.   Please call asap  [phone]

## 2019-11-01 DIAGNOSIS — N200 Calculus of kidney: Secondary | ICD-10-CM | POA: Diagnosis not present

## 2019-11-01 NOTE — Telephone Encounter (Signed)
If he can wait to make an appt in next open slot today-make an appt.  If fever, decreased urine output or severe pain that can wait to the appt>> go to ED

## 2019-11-13 DIAGNOSIS — N200 Calculus of kidney: Secondary | ICD-10-CM | POA: Diagnosis not present

## 2019-11-21 DIAGNOSIS — N289 Disorder of kidney and ureter, unspecified: Secondary | ICD-10-CM | POA: Diagnosis not present

## 2019-11-21 DIAGNOSIS — N200 Calculus of kidney: Secondary | ICD-10-CM | POA: Diagnosis not present

## 2019-11-29 DIAGNOSIS — N400 Enlarged prostate without lower urinary tract symptoms: Secondary | ICD-10-CM | POA: Diagnosis not present

## 2019-11-29 DIAGNOSIS — N281 Cyst of kidney, acquired: Secondary | ICD-10-CM | POA: Diagnosis not present

## 2019-11-29 DIAGNOSIS — N132 Hydronephrosis with renal and ureteral calculous obstruction: Secondary | ICD-10-CM | POA: Diagnosis not present

## 2019-11-29 DIAGNOSIS — N200 Calculus of kidney: Secondary | ICD-10-CM | POA: Diagnosis not present

## 2019-11-29 DIAGNOSIS — N289 Disorder of kidney and ureter, unspecified: Secondary | ICD-10-CM | POA: Diagnosis not present

## 2020-11-23 DIAGNOSIS — U071 COVID-19: Secondary | ICD-10-CM

## 2020-11-23 HISTORY — DX: COVID-19: U07.1

## 2021-01-09 ENCOUNTER — Other Ambulatory Visit: Payer: Self-pay

## 2021-01-10 ENCOUNTER — Encounter: Payer: Self-pay | Admitting: Family Medicine

## 2021-01-10 ENCOUNTER — Ambulatory Visit (INDEPENDENT_AMBULATORY_CARE_PROVIDER_SITE_OTHER): Payer: 59 | Admitting: Family Medicine

## 2021-01-10 VITALS — BP 178/104 | HR 75 | Temp 97.7°F | Ht 67.0 in | Wt 182.0 lb

## 2021-01-10 DIAGNOSIS — Z125 Encounter for screening for malignant neoplasm of prostate: Secondary | ICD-10-CM | POA: Diagnosis not present

## 2021-01-10 DIAGNOSIS — U071 COVID-19: Secondary | ICD-10-CM

## 2021-01-10 DIAGNOSIS — Z23 Encounter for immunization: Secondary | ICD-10-CM

## 2021-01-10 DIAGNOSIS — E663 Overweight: Secondary | ICD-10-CM | POA: Diagnosis not present

## 2021-01-10 DIAGNOSIS — Z0001 Encounter for general adult medical examination with abnormal findings: Secondary | ICD-10-CM

## 2021-01-10 DIAGNOSIS — Z87891 Personal history of nicotine dependence: Secondary | ICD-10-CM

## 2021-01-10 DIAGNOSIS — Z85038 Personal history of other malignant neoplasm of large intestine: Secondary | ICD-10-CM | POA: Diagnosis not present

## 2021-01-10 DIAGNOSIS — I10 Essential (primary) hypertension: Secondary | ICD-10-CM

## 2021-01-10 DIAGNOSIS — Z1159 Encounter for screening for other viral diseases: Secondary | ICD-10-CM

## 2021-01-10 DIAGNOSIS — Z131 Encounter for screening for diabetes mellitus: Secondary | ICD-10-CM | POA: Diagnosis not present

## 2021-01-10 DIAGNOSIS — Z8249 Family history of ischemic heart disease and other diseases of the circulatory system: Secondary | ICD-10-CM

## 2021-01-10 HISTORY — DX: COVID-19: U07.1

## 2021-01-10 LAB — LIPID PANEL
Cholesterol: 148 mg/dL (ref 0–200)
HDL: 37.6 mg/dL — ABNORMAL LOW (ref >39.00)
LDL Cholesterol: 94 mg/dL (ref 0–99)
NonHDL: 110.78
Total CHOL/HDL Ratio: 4
Triglycerides: 84 mg/dL (ref 0.0–149.0)
VLDL: 16.8 mg/dL (ref 0.0–40.0)

## 2021-01-10 LAB — COMPREHENSIVE METABOLIC PANEL
ALT: 19 U/L (ref 0–53)
AST: 21 U/L (ref 0–37)
Albumin: 3.9 g/dL (ref 3.5–5.2)
Alkaline Phosphatase: 57 U/L (ref 39–117)
BUN: 18 mg/dL (ref 6–23)
CO2: 28 mEq/L (ref 19–32)
Calcium: 8.9 mg/dL (ref 8.4–10.5)
Chloride: 106 mEq/L (ref 96–112)
Creatinine, Ser: 1.13 mg/dL (ref 0.40–1.50)
GFR: 73.26 mL/min (ref >60.00)
Glucose, Bld: 92 mg/dL (ref 70–99)
Potassium: 3.8 mEq/L (ref 3.5–5.1)
Sodium: 141 mEq/L (ref 135–145)
Total Bilirubin: 0.6 mg/dL (ref 0.2–1.2)
Total Protein: 6.9 g/dL (ref 6.0–8.3)

## 2021-01-10 LAB — CBC WITH DIFFERENTIAL/PLATELET
Basophils Absolute: 0.1 10*3/uL (ref 0.0–0.1)
Basophils Relative: 1 % (ref 0.0–3.0)
Eosinophils Absolute: 0.4 10*3/uL (ref 0.0–0.7)
Eosinophils Relative: 7.4 % — ABNORMAL HIGH (ref 0.0–5.0)
HCT: 43.2 % (ref 39.0–52.0)
Hemoglobin: 14.4 g/dL (ref 13.0–17.0)
Lymphocytes Relative: 25.2 % (ref 12.0–46.0)
Lymphs Abs: 1.4 10*3/uL (ref 0.7–4.0)
MCHC: 33.3 g/dL (ref 30.0–36.0)
MCV: 87 fl (ref 78.0–100.0)
Monocytes Absolute: 0.5 10*3/uL (ref 0.1–1.0)
Monocytes Relative: 9.7 % (ref 3.0–12.0)
Neutro Abs: 3.1 10*3/uL (ref 1.4–7.7)
Neutrophils Relative %: 56.7 % (ref 43.0–77.0)
Platelets: 217 10*3/uL (ref 150.0–400.0)
RBC: 4.96 Mil/uL (ref 4.22–5.81)
RDW: 14 % (ref 11.5–15.5)
WBC: 5.5 10*3/uL (ref 4.0–10.5)

## 2021-01-10 LAB — PSA: PSA: 0.36 ng/mL (ref 0.10–4.00)

## 2021-01-10 LAB — TSH: TSH: 1.47 u[IU]/mL (ref 0.35–4.50)

## 2021-01-10 LAB — HEMOGLOBIN A1C: Hgb A1c MFr Bld: 5.5 % (ref 4.6–6.5)

## 2021-01-10 MED ORDER — LOSARTAN POTASSIUM 50 MG PO TABS: 50.0000 mg | ORAL_TABLET | Freq: Every day | ORAL | 1 refills | Status: DC

## 2021-01-10 NOTE — Progress Notes (Signed)
This visit occurred during the SARS-CoV-2 public health emergency.  Safety protocols were in place, including screening questions prior to the visit, additional usage of staff PPE, and extensive cleaning of exam room while observing appropriate contact time as indicated for disinfecting solutions.    Patient ID: [name], male  DOB: 1966/11/07, 55 y.o.   MRN: [id number] Patient Care Team    Relationship Specialty Notifications Start End  Ma Hillock, DO PCP - General Family Medicine  02/10/16   Doran Stabler, MD Consulting Physician Gastroenterology  04/19/18   Ladell Pier, MD Consulting Physician Oncology  05/03/19     Chief Complaint  Patient presents with  . Annual Exam    Pt is fasting     Subjective: [name] is a 55 y.o. male present for CPE/cmc. All past medical history, surgical history, allergies, family history, immunizations, medications and social history were updated in the electronic medical record today. All recent labs, ED visits and hospitalizations within the last year were reviewed.  Health maintenance:  Colonoscopy: 03/2016, colon resection in 2008 secondary to colon cancer.5 yr f/u.Mother also had colon cancer. He has a daughter that has had normal screenings.Dr. Loletha Carrow.  Immunizations: tdap UTD (2012), declines flu shot in the past. Shingrix #1 today. Infectious disease screening:HIVcompleted3/2018 PSA: no fhx.  Lab Results  Component Value Date   PSA 0.36 05/02/2019   PSA 0.45 04/19/2018   PSA 0.42 02/10/2017  , pt was counseled on prostate cancer screenings.  Assistive device: none Oxygen WUJ:WJXB Patient has a Dental home. Hospitalizations/ED visits: reviewed  Hypertension/overweight/former smoker/fhx heart disease in mother: Pt reportshe has not been taking his losartan. Last yr he stopped amlodipine because it caused him stomach upset.  In the past he stopped lisinopril in the past because it caused him a  Headache.  Patient denies chest pain, shortness of breath, dizziness or lower extremity edema.  Microalb: elevated 2018 Diet:no routine Exercise:no routine RF:HTN, former smoker, Fhx   Depression screen [facility] 2/9 01/10/2021 05/02/2019 04/19/2018 02/10/2017  Decreased Interest 0 0 0 0  Down, Depressed, Hopeless 0 0 0 0  PHQ - 2 Score 0 0 0 0   No flowsheet data found.       Fall Risk  04/19/2018 02/10/2017  Falls in the past year? No No      Immunization History  Administered Date(s) Administered  . Influenza-Unspecified 07/24/2018  . Tdap 11/23/2010, 01/10/2021  . Zoster Recombinat (Shingrix) 05/02/2019, 08/07/2019     Past Medical History:  Diagnosis Date  . Chicken pox   . Colon cancer ([place])    colon  . COVID 11/2020  . Hepatitis B   . Hypertension   . Lymphadenitis   . Pneumothorax    Allergies  Allergen Reactions  . Amlodipine Nausea And Vomiting    Stomach upset   . Lisinopril Other (See Comments)    headache   Past Surgical History:  Procedure Laterality Date  . PARTIAL COLECTOMY     colon caner   Family History  Problem Relation Age of Onset  . Arthritis Mother   . Cancer Mother   . COPD Mother   . Heart disease Mother   . Colon cancer Mother 74  . Colon polyps Daughter   . Colon cancer Maternal Uncle   . Colon cancer Cousin   . Esophageal cancer Neg Hx   . Pancreatic cancer Neg Hx   . Prostate cancer Neg Hx   . Rectal  cancer Neg Hx   . Stomach cancer Neg Hx    Social History   Social History Narrative   Single, 1 child.   Self-employed, college education.   Drinks caffeinated beverages, uses herbal remedies.   Wears his seatbelt, exercises greater than 3 times a week.   Smoke detector in the home, firearms in the home in a locked cabinet.   Feels safe in his relationships.    Allergies as of 01/10/2021      Reactions   Amlodipine Nausea And Vomiting   Stomach upset   Lisinopril Other (See Comments)   headache      Medication List        Accurate as of January 10, 2021  9:30 AM. If you have any questions, ask your nurse or doctor.        losartan 50 MG tablet Commonly known as: COZAAR Take 1 tablet (50 mg total) by mouth daily.      All past medical history, surgical history, allergies, family history, immunizations andmedications were updated in the EMR today and reviewed under the history and medication portions of their EMR.     No results found for this or any previous visit (from the past 2160 hour(s)).  No results found.   ROS: 14 pt review of systems performed and negative (unless mentioned in an HPI)  Objective: BP (!) 178/104   Pulse 75   Temp 97.7 F (36.5 C) (Oral)   Ht 5\' 7"  (1.702 m)   Wt 182 lb (82.6 kg)   SpO2 98%   BMI 28.51 kg/m  Gen: Afebrile. No acute distress. Nontoxic in appearance, well-developed, well-nourished,  Pleasant male.  HENT: AT. [place]. Bilateral TM visualized and normal in appearance, normal external auditory canal. MMM, no oral lesions, adequate dentition. Bilateral nares within normal limits. Throat without erythema, ulcerations or exudates. no Cough on exam, no hoarseness on exam. Eyes:Pupils Equal Round Reactive to light, Extraocular movements intact,  Conjunctiva without redness, discharge or icterus. Neck/lymp/endocrine: Supple,no lymphadenopathy, no thyromegaly CV: RRR no murmur, no edema, +2/4 P posterior tibialis pulses.  Chest: CTAB, no wheeze, rhonchi or crackles. normal Respiratory effort. good Air movement. Abd: Soft. flat. NTND. BS present. no Masses palpated. No hepatosplenomegaly. No rebound tenderness or guarding. Skin: no rashes, purpura or petechiae. Warm and well-perfused. Skin intact. Neuro/Msk:  Normal gait. PERLA. EOMi. Alert. Oriented x3.  Cranial nerves II through XII intact. Muscle strength 5/5 upper/lower extremity. DTRs equal bilaterally. Psych: Normal affect, dress and demeanor. Normal speech. Normal thought content and judgment.  No exam data  present  Assessment/plan: [name] is a 55 y.o. male present for CPE/cmc Essential hypertension/Overweight (BMI 25.0-29.9)/Family history of heart disease/former smoker - noncompliance is still an issue. Discussed again  the importance of taking his BP medication daily. If he has problems with one- he is encouraged to communicate that with [place] so we can provide him with other options. Discussed potential consequences of uncontrolled HTN on the body. Pt agreed to start new med losartan. - restart losartan 50 mg qd.  - heart healthy diet encouraged.  - cbc, cmp, tsh, lipids collected today.  - Cardiac CT ordered. - Consider AAA screen at 31.  F/u 4 weeks.   H/O colon cancer, stage III Due in May for repeat colonoscopy> referral placed.   Diabetes mellitus screening - Hemoglobin A1c Prostate cancer screening - PSA Need for hepatitis C screening test - Hepatitis C antibody Need for tetanus booster - Tdap vaccine greater  than or equal to 7yo IM  Encounter for general adult medical examination with abnormal findings Patient was encouraged to exercise greater than 150 minutes a week. Patient was encouraged to choose a diet filled with fresh fruits and vegetables, and lean meats. AVS provided to patient today for education/recommendation on gender specific health and safety maintenance. Colonoscopy: 03/2016, colon resection in 2008 secondary to colon cancer.5 yr f/u.Mother also had colon cancer. He has a daughter that has had normal screenings.Dr. Loletha Carrow.  Immunizations: tdap UTD (2012), declines flu shot in the past. Shingrix #1 today. Infectious disease screening:HIVcompleted3/2018 PSA: no fhx.   *Return in about 4 weeks (around 02/07/2021) for [place] (30 min) and 1 yr cpe, [facility] (30 min).  Orders Placed This Encounter  Procedures  . CT CARDIAC SCORING (SELF PAY ONLY)  . Tdap vaccine greater than or equal to 7yo IM  . CBC with Differential/Platelet  . Comprehensive metabolic panel   . Hemoglobin A1c  . Lipid panel  . TSH  . Hepatitis C antibody  . PSA  . Ambulatory referral to Gastroenterology   Orders Placed This Encounter  Procedures  . CT CARDIAC SCORING (SELF PAY ONLY)  . Tdap vaccine greater than or equal to 7yo IM  . CBC with Differential/Platelet  . Comprehensive metabolic panel  . Hemoglobin A1c  . Lipid panel  . TSH  . Hepatitis C antibody  . PSA  . Ambulatory referral to Gastroenterology   Meds ordered this encounter  Medications  . losartan (COZAAR) 50 MG tablet    Sig: Take 1 tablet (50 mg total) by mouth daily.    Dispense:  90 tablet    Refill:  1    Please dc all losartan scripts prior (including one from earlier today) pt was taking medication improperly-    Referral Orders     Ambulatory referral to Gastroenterology   Note is dictated utilizing voice recognition software. Although note has been proof read prior to signing, occasional typographical errors still can be missed. If any questions arise, please do not hesitate to call for verification.  Electronically signed by: Howard Pouch, DO [place]

## 2021-01-10 NOTE — Patient Instructions (Signed)

## 2021-01-13 ENCOUNTER — Telehealth: Payer: Self-pay | Admitting: Family Medicine

## 2021-01-13 LAB — HEPATITIS C ANTIBODY
Hepatitis C Ab: NONREACTIVE
SIGNAL TO CUT-OFF: 0.03 (ref <1.00)

## 2021-01-13 NOTE — Telephone Encounter (Signed)
[name] has denied authorization for CT cardiac scoring. Forwarding to Dr. Raoul Pitch for peer to peer reconsideration. Phone number for peer to peer is [phone].

## 2021-01-13 NOTE — Telephone Encounter (Signed)
FYI

## 2021-01-14 NOTE — Telephone Encounter (Signed)
This is a self pay test. Please do not send to insurance. Pt to be scheduled and is under the $99 out of pocket self pay CT cardiac scoring.  Please do not hold up scheduling.

## 2021-01-14 NOTE — Telephone Encounter (Signed)
FYI

## 2021-01-23 ENCOUNTER — Encounter: Payer: Self-pay | Admitting: Gastroenterology

## 2021-02-02 ENCOUNTER — Encounter: Payer: Self-pay | Admitting: Family Medicine

## 2021-02-03 ENCOUNTER — Ambulatory Visit: Payer: 59

## 2021-02-03 ENCOUNTER — Telehealth (INDEPENDENT_AMBULATORY_CARE_PROVIDER_SITE_OTHER): Payer: 59 | Admitting: Family Medicine

## 2021-02-03 ENCOUNTER — Encounter: Payer: Self-pay | Admitting: Family Medicine

## 2021-02-03 VITALS — BP 144/73

## 2021-02-03 DIAGNOSIS — R059 Cough, unspecified: Secondary | ICD-10-CM

## 2021-02-03 DIAGNOSIS — J40 Bronchitis, not specified as acute or chronic: Secondary | ICD-10-CM

## 2021-02-03 LAB — POCT INFLUENZA A/B
Influenza A, POC: NEGATIVE
Influenza B, POC: NEGATIVE

## 2021-02-03 MED ORDER — PREDNISONE 50 MG PO TABS: 50.0000 mg | ORAL_TABLET | Freq: Every day | ORAL | 0 refills | Status: DC

## 2021-02-03 MED ORDER — DOXYCYCLINE HYCLATE 100 MG PO TABS: 100.0000 mg | ORAL_TABLET | Freq: Two times a day (BID) | ORAL | 0 refills | Status: DC

## 2021-02-03 NOTE — Patient Instructions (Signed)

## 2021-02-03 NOTE — Progress Notes (Signed)
Pt here for lab visit only 

## 2021-02-03 NOTE — Progress Notes (Signed)
VIRTUAL VISIT VIA VIDEO  I connected with Marvene Staff on 02/03/21 at 11:30 AM EDT by elemedicine application and verified that I am speaking with the correct person using two identifiers. Location patient: Home Location provider: [facility], Office Persons participating in the virtual visit: Patient, Dr. Raoul Pitch and Samul Dada, CMA  I discussed the limitations of evaluation and management by telemedicine and the availability of in person appointments. The patient expressed understanding and agreed to proceed.   SUBJECTIVE Chief Complaint  Patient presents with  . Cough    Pt c/o cough, nasal/chest congestion x 4 days    HPI: [name] is a 55 y.o. male present for upper resp. Symptoms. He had covid in Jan. 2022. He reported a very mild case of 2 days of symptoms, but cough remained after covid. He is a former smoker. 4-5 days ago (Thursday afternoon) his cough worsened, his nose became runny and he became very hoarse. It has since settled into chest congestion. He denies shortness of breath. He denies fever, chills, loss of taste or smell. He has not had the chance to be vaccinated since his covid in Jan. He has been taking theraflu.    ROS: See pertinent positives and negatives per HPI.  Patient Active Problem List   Diagnosis Date Noted  . Family history of heart disease 01/10/2021  . Diabetes mellitus screening 01/10/2021  . Prostate cancer screening 01/10/2021  . COVID-19 01/10/2021  . Encounter for preventive health examination 02/10/2017  . Overweight (BMI 25.0-29.9) 02/10/2017  . Essential hypertension 02/10/2017  . H/O colon cancer, stage III 02/10/2016    Social History   Tobacco Use  . Smoking status: Former Research scientist (life sciences)  . Smokeless tobacco: Never Used  Substance Use Topics  . Alcohol use: No    Alcohol/week: 0.0 standard drinks    Current Outpatient Medications:  .  doxycycline (VIBRA-TABS) 100 MG tablet, Take 1 tablet (100 mg total) by mouth 2  (two) times daily., Disp: 20 tablet, Rfl: 0 .  losartan (COZAAR) 50 MG tablet, Take 1 tablet (50 mg total) by mouth daily., Disp: 90 tablet, Rfl: 1 .  predniSONE (DELTASONE) 50 MG tablet, Take 1 tablet (50 mg total) by mouth daily with breakfast., Disp: 5 tablet, Rfl: 0  Allergies  Allergen Reactions  . Amlodipine Nausea And Vomiting    Stomach upset   . Lisinopril Other (See Comments)    headache    OBJECTIVE: BP (!) 144/73  Gen: No acute distress. Nontoxic in appearance.  HENT: AT. [place].  MMM.  Eyes:Pupils Equal Round Reactive to light, Extraocular movements intact,  Conjunctiva without redness, discharge or icterus. Chest: Cough present on exam. No shortness of breath. Hoarseness present.  Skin: no rashes, purpura or petechiae.  Neuro:  Normal gait. Alert. Oriented x3  Psych: Normal affect and demeanor. Normal speech. Normal thought content and judgment.  ASSESSMENT AND PLAN: [name] is a 55 y.o. male present for  Cough/Bronchitis Rest, hydrate.  +/- flonase, mucinex (DM if cough), nettie pot or nasal saline.  Doxy and  pred prescribed, take until completed.  If cough present it can last up to 6-8 weeks.  F/U 2 weeks if not improved.  Testing arranged. Pt aware to self isolate until results returned.  - Novel Coronavirus, NAA ([facility]) - POCT Influenza A/B   Howard Pouch, DO 02/03/2021   Return if symptoms worsen or fail to improve.  Orders Placed This Encounter  Procedures  . Novel Coronavirus,  NAA ([facility])  . POCT Influenza A/B   Meds ordered this encounter  Medications  . doxycycline (VIBRA-TABS) 100 MG tablet    Sig: Take 1 tablet (100 mg total) by mouth 2 (two) times daily.    Dispense:  20 tablet    Refill:  0  . predniSONE (DELTASONE) 50 MG tablet    Sig: Take 1 tablet (50 mg total) by mouth daily with breakfast.    Dispense:  5 tablet    Refill:  0   Referral Orders  No referral(s) requested today

## 2021-02-04 ENCOUNTER — Encounter: Payer: Self-pay | Admitting: Family Medicine

## 2021-02-04 LAB — NOVEL CORONAVIRUS, NAA: SARS-CoV-2, NAA: NOT DETECTED

## 2021-02-04 LAB — SARS-COV-2, NAA 2 DAY TAT

## 2021-02-04 LAB — SPECIMEN STATUS REPORT

## 2021-02-07 ENCOUNTER — Ambulatory Visit: Payer: 59 | Admitting: Family Medicine

## 2021-02-21 ENCOUNTER — Ambulatory Visit: Payer: 59 | Admitting: Family Medicine

## 2021-02-28 ENCOUNTER — Encounter: Payer: Self-pay | Admitting: Family Medicine

## 2021-02-28 ENCOUNTER — Other Ambulatory Visit: Payer: Self-pay

## 2021-02-28 ENCOUNTER — Ambulatory Visit (INDEPENDENT_AMBULATORY_CARE_PROVIDER_SITE_OTHER): Payer: 59 | Admitting: Family Medicine

## 2021-02-28 VITALS — BP 180/96 | HR 70 | Temp 98.8°F | Ht 67.0 in | Wt 181.0 lb

## 2021-02-28 DIAGNOSIS — Z8249 Family history of ischemic heart disease and other diseases of the circulatory system: Secondary | ICD-10-CM

## 2021-02-28 DIAGNOSIS — E663 Overweight: Secondary | ICD-10-CM

## 2021-02-28 DIAGNOSIS — I10 Essential (primary) hypertension: Secondary | ICD-10-CM | POA: Diagnosis not present

## 2021-02-28 MED ORDER — LOSARTAN POTASSIUM 100 MG PO TABS: 100.0000 mg | ORAL_TABLET | Freq: Every day | ORAL | 1 refills | Status: DC

## 2021-02-28 NOTE — Progress Notes (Signed)
This visit occurred during the SARS-CoV-2 public health emergency.  Safety protocols were in place, including screening questions prior to the visit, additional usage of staff PPE, and extensive cleaning of exam room while observing appropriate contact time as indicated for disinfecting solutions.    Patient ID: [name], male  DOB: 04/13/1966, 55 y.o.   MRN: [id number] Patient Care Team    Relationship Specialty Notifications Start End  Ma Hillock, DO PCP - General Family Medicine  02/10/16   Doran Stabler, MD Consulting Physician Gastroenterology  04/19/18   Ladell Pier, MD Consulting Physician Oncology  05/03/19     Chief Complaint  Patient presents with  . Follow-up    [facility]; pt is not fasting     Subjective: [name] is a 55 y.o. male present for cmc Hypertension/overweight/former smoker/fhx heart disease in mother: Pt reports compliance with losartan 50 mg qd. He has not yet taken it today. He brings in pictures of his BP readings and motst all 140's-150s/70-80s.  Tried: amlodipine because it caused him stomach upset. Lisinopril caused him a  Headache. Patient denies chest pain, shortness of breath, dizziness or lower extremity edema.  Labs utd Diet:no routine Exercise:no routine RF:HTN, former smoker, Fhx   Depression screen [facility] 2/9 01/10/2021 05/02/2019 04/19/2018 02/10/2017  Decreased Interest 0 0 0 0  Down, Depressed, Hopeless 0 0 0 0  PHQ - 2 Score 0 0 0 0   No flowsheet data found.      Fall Risk  04/19/2018 02/10/2017  Falls in the past year? No No    Immunization History  Administered Date(s) Administered  . Influenza-Unspecified 07/24/2018  . Tdap 11/23/2010, 01/10/2021  . Zoster Recombinat (Shingrix) 05/02/2019, 08/07/2019    Past Medical History:  Diagnosis Date  . Chicken pox   . Colon cancer ([place])    colon  . COVID 11/2020  . Hepatitis B   . Hypertension   . Lymphadenitis   . Pneumothorax    Allergies  Allergen  Reactions  . Amlodipine Nausea And Vomiting    Stomach upset   . Lisinopril Other (See Comments)    headache   Past Surgical History:  Procedure Laterality Date  . PARTIAL COLECTOMY     colon caner   Family History  Problem Relation Age of Onset  . Arthritis Mother   . Cancer Mother   . COPD Mother   . Heart disease Mother   . Colon cancer Mother 19  . Colon polyps Daughter   . Colon cancer Maternal Uncle   . Colon cancer Cousin   . Esophageal cancer Neg Hx   . Pancreatic cancer Neg Hx   . Prostate cancer Neg Hx   . Rectal cancer Neg Hx   . Stomach cancer Neg Hx    Social History   Social History Narrative   Single, 1 child.   Self-employed, college education.   Drinks caffeinated beverages, uses herbal remedies.   Wears his seatbelt, exercises greater than 3 times a week.   Smoke detector in the home, firearms in the home in a locked cabinet.   Feels safe in his relationships.    Allergies as of 02/28/2021      Reactions   Amlodipine Nausea And Vomiting   Stomach upset   Lisinopril Other (See Comments)   headache      Medication List       Accurate as of February 28, 2021  9:31 AM. If you have  any questions, ask your nurse or doctor.        STOP taking these medications   doxycycline 100 MG tablet Commonly known as: VIBRA-TABS Stopped by: Howard Pouch, DO   predniSONE 50 MG tablet Commonly known as: DELTASONE Stopped by: Howard Pouch, DO     TAKE these medications   losartan 100 MG tablet Commonly known as: Cozaar Take 1 tablet (100 mg total) by mouth daily. What changed:   medication strength  how much to take Changed by: Howard Pouch, DO      All past medical history, surgical history, allergies, family history, immunizations andmedications were updated in the EMR today and reviewed under the history and medication portions of their EMR.      No results found.   ROS: 14 pt review of systems performed and negative (unless mentioned in an  HPI)  Objective: BP (!) 180/96   Pulse 70   Temp 98.8 F (37.1 C) (Oral)   Ht 5\' 7"  (1.702 m)   Wt 181 lb (82.1 kg)   SpO2 98%   BMI 28.35 kg/m  Gen: Afebrile. No acute distress. Nontoxic. Pleasant male.  HENT: AT. [place].  Eyes:Pupils Equal Round Reactive to light, Extraocular movements intact,  Conjunctiva without redness, discharge or icterus. CV: RRR no murnur, no edema Chest: CTAB, no wheeze or crackles Neuro: Normal gait. PERLA. EOMi. Alert. Orientedx3 Psych: Normal affect, dress and demeanor. Normal speech. Normal thought content and judgment.  No exam data present  Assessment/plan: [name] is a 55 y.o. male present for CPE/cmc Essential hypertension/Overweight (BMI 25.0-29.9)/Family history of heart disease/former smoker - BP med not in his system this morning. Home pressures 140s/70-80s. - increase to losartan 100 mg qd - heart healthy diet encouraged.  - UTD - Cardiac CT - self pay ordered last visit, pt did not get scheduled. Placed again today. He understands it is $99 out of pocket cost. He is desiring cardiac CT d/t fhx and his HTN.  - Consider AAA screen at 61.  F/u 12 weeks - pt encouraged to have BP in system at least 2 hours prior to appt.    *Return in about 3 months (around 06/02/2021) for [place] (30 min).  Orders Placed This Encounter  Procedures  . CT CARDIAC SCORING (SELF PAY ONLY)   Orders Placed This Encounter  Procedures  . CT CARDIAC SCORING (SELF PAY ONLY)   Meds ordered this encounter  Medications  . losartan (COZAAR) 100 MG tablet    Sig: Take 1 tablet (100 mg total) by mouth daily.    Dispense:  90 tablet    Refill:  1    DC prior scripts for med. Dose change   Referral Orders  No referral(s) requested today     Note is dictated utilizing voice recognition software. Although note has been proof read prior to signing, occasional typographical errors still can be missed. If any questions arise, please do not hesitate to call for  verification.  Electronically signed by: Howard Pouch, DO [place]

## 2021-02-28 NOTE — Patient Instructions (Addendum)
     Increased losartan to 100 mg a day.  Goal is less than 130/80.

## 2021-03-28 ENCOUNTER — Ambulatory Visit (AMBULATORY_SURGERY_CENTER): Payer: Self-pay | Admitting: *Deleted

## 2021-03-28 ENCOUNTER — Other Ambulatory Visit: Payer: Self-pay

## 2021-03-28 VITALS — Ht 67.0 in | Wt 179.0 lb

## 2021-03-28 DIAGNOSIS — Z85038 Personal history of other malignant neoplasm of large intestine: Secondary | ICD-10-CM

## 2021-03-28 DIAGNOSIS — Z86010 Personal history of colonic polyps: Secondary | ICD-10-CM

## 2021-03-28 DIAGNOSIS — Z800 Family history of malignant neoplasm of digestive organs: Secondary | ICD-10-CM

## 2021-03-28 MED ORDER — SUPREP BOWEL PREP KIT 17.5-3.13-1.6 GM/177ML PO SOLN: 1.0000 | Freq: Once | ORAL | 0 refills | Status: AC

## 2021-03-28 NOTE — Progress Notes (Signed)

## 2021-03-31 ENCOUNTER — Ambulatory Visit (INDEPENDENT_AMBULATORY_CARE_PROVIDER_SITE_OTHER): Payer: 59 | Admitting: Family Medicine

## 2021-03-31 ENCOUNTER — Telehealth: Payer: Self-pay

## 2021-03-31 ENCOUNTER — Other Ambulatory Visit: Payer: Self-pay

## 2021-03-31 ENCOUNTER — Encounter: Payer: Self-pay | Admitting: Family Medicine

## 2021-03-31 VITALS — BP 143/80 | HR 69 | Temp 97.4°F | Ht 67.0 in | Wt 179.0 lb

## 2021-03-31 DIAGNOSIS — R21 Rash and other nonspecific skin eruption: Secondary | ICD-10-CM | POA: Diagnosis not present

## 2021-03-31 DIAGNOSIS — S40862A Insect bite (nonvenomous) of left upper arm, initial encounter: Secondary | ICD-10-CM

## 2021-03-31 DIAGNOSIS — W57XXXA Bitten or stung by nonvenomous insect and other nonvenomous arthropods, initial encounter: Secondary | ICD-10-CM | POA: Diagnosis not present

## 2021-03-31 MED ORDER — DOXYCYCLINE HYCLATE 100 MG PO TABS: 100.0000 mg | ORAL_TABLET | Freq: Two times a day (BID) | ORAL | 0 refills | Status: DC

## 2021-03-31 NOTE — Progress Notes (Signed)
This visit occurred during the SARS-CoV-2 public health emergency.  Safety protocols were in place, including screening questions prior to the visit, additional usage of staff PPE, and extensive cleaning of exam room while observing appropriate contact time as indicated for disinfecting solutions.    [name] , 07/30/1966, 55 y.o., male MRN: [id number] Patient Care Team    Relationship Specialty Notifications Start End  Jorge Hillock, DO PCP - General Family Medicine  02/10/16   Jorge Stabler, MD Consulting Physician Gastroenterology  04/19/18   Jorge Pier, MD Consulting Physician Oncology  05/03/19     Chief Complaint  Patient presents with  . Tick Removal    Tick bite on left arm around 2 weeks. Red and warm to the touch     Subjective: Pt presents for an OV with complaints of tick bite about 2 weeks ago. He pulled a small tick off of his left axillary fold region. Over the last few days he has noticed a rash developing around tick bite. He denies fever, headache, fatigue or rash in other locations. He has noticed a swollen lymph node on his left neck. He also pulled a tick off his scalp just prior to this current event.  Depression screen [facility] 2/9 01/10/2021 05/02/2019 04/19/2018 02/10/2017  Decreased Interest 0 0 0 0  Down, Depressed, Hopeless 0 0 0 0  PHQ - 2 Score 0 0 0 0    Allergies  Allergen Reactions  . Amlodipine Nausea And Vomiting    Stomach upset   . Lisinopril Other (See Comments)    headache   Social History   Social History Narrative   Single, 1 child.   Self-employed, college education.   Drinks caffeinated beverages, uses herbal remedies.   Wears his seatbelt, exercises greater than 3 times a week.   Smoke detector in the home, firearms in the home in a locked cabinet.   Feels safe in his relationships.   Past Medical History:  Diagnosis Date  . Allergy    mild- uses honey   . Chicken pox   . Chronic kidney disease    kidney stone    . Colon cancer ([place])    colon  . COVID 11/2020  . GERD (gastroesophageal reflux disease)    diet related   . Hepatitis B   . Hypertension   . Lymphadenitis   . Pneumothorax    Past Surgical History:  Procedure Laterality Date  . COLON SURGERY  2008  . COLONOSCOPY    . PARTIAL COLECTOMY     colon caner  . POLYPECTOMY     Family History  Problem Relation Age of Onset  . Arthritis Mother   . Cancer Mother   . COPD Mother   . Heart disease Mother   . Colon cancer Mother 98  . Colon polyps Daughter   . Colon cancer Maternal Uncle   . Colon cancer Cousin   . Esophageal cancer Neg Hx   . Pancreatic cancer Neg Hx   . Prostate cancer Neg Hx   . Rectal cancer Neg Hx   . Stomach cancer Neg Hx    Allergies as of 03/31/2021      Reactions   Amlodipine Nausea And Vomiting   Stomach upset   Lisinopril Other (See Comments)   headache      Medication List       Accurate as of Mar 31, 2021 11:44 AM. If you have any questions, ask  your nurse or doctor.        doxycycline 100 MG tablet Commonly known as: VIBRA-TABS Take 1 tablet (100 mg total) by mouth 2 (two) times daily. Started by: Jorge Pouch, DO   losartan 100 MG tablet Commonly known as: Cozaar Take 1 tablet (100 mg total) by mouth daily.   OLIVE LEAF EXTRACT PO Take by mouth.   vitamin C 1000 MG tablet Take 1,000 mg by mouth daily.   VITAMIN D-3 PO Take by mouth.       All past medical history, surgical history, allergies, family history, immunizations andmedications were updated in the EMR today and reviewed under the history and medication portions of their EMR.     ROS: Negative, with the exception of above mentioned in HPI   Objective:  BP (!) 143/80 (BP Location: Left Arm, Patient Position: Sitting, Cuff Size: Large)   Pulse 69   Temp (!) 97.4 F (36.3 C) (Temporal)   Ht 5\' 7"  (1.702 m)   Wt 179 lb (81.2 kg)   SpO2 97%   BMI 28.04 kg/m  Body mass index is 28.04 kg/m. Gen: Afebrile. No  acute distress. Nontoxic in appearance, well developed, well nourished.  HENT: AT. [place].  Eyes:Pupils Equal Round Reactive to light, Extraocular movements intact,  Conjunctiva without redness, discharge or icterus. Neck/lymp/endocrine: Supple,left ant cervical  Lymphadenopathy present x1 CV: RRR  MSK: no  swelling or erythema of joints.  Skin: red circular rash developing surround left axillary tick bite.  Neuro: Normal gait. PERLA. EOMi. Alert. Oriented x3  Psych: Normal affect, dress and demeanor. Normal speech. Normal thought content and judgment.  No exam data present No results found. No results found for this or any previous visit (from the past 24 hour(s)).  Assessment/Plan: [name] is a 55 y.o. male present for OV for  Tick bite of left upper arm, initial encounter/Rash multiple exposures to ticks  Past few weeks. He does have red round rash developing around tick bite in axillary fold of left arm.  Discussed starting doxy bid x 14 days. If titre positive or other sx present would extend course to 21 days.  Pt educated on RMSF and lyme symptoms.  - doxycycline (VIBRA-TABS) 100 MG tablet; Take 1 tablet (100 mg total) by mouth 2 (two) times daily.  Dispense: 28 tablet; Refill: 0 - B. burgdorfi antibodies - [place] mtn spotted fvr abs pnl(IgG+IgM) F/u dependent up on labs results.     Reviewed expectations re: course of current medical issues.  Discussed self-management of symptoms.  Outlined signs and symptoms indicating need for more acute intervention.  Patient verbalized understanding and all questions were answered.  Patient received an After-Visit Summary.    Orders Placed This Encounter  Procedures  . B. burgdorfi antibodies  . [place] mtn spotted fvr abs pnl(IgG+IgM)   Meds ordered this encounter  Medications  . doxycycline (VIBRA-TABS) 100 MG tablet    Sig: Take 1 tablet (100 mg total) by mouth 2 (two) times daily.    Dispense:  28 tablet    Refill:  0    Referral Orders  No referral(s) requested today     Note is dictated utilizing voice recognition software. Although note has been proof read prior to signing, occasional typographical errors still can be missed. If any questions arise, please do not hesitate to call for verification.   electronically signed by:  Jorge Pouch, DO  [place]

## 2021-03-31 NOTE — Telephone Encounter (Signed)
Pt has scheduled appt with PCP today for evaluation.  [name] - Client TELEPHONE ADVICE RECORD [facility] Patient Name: [name] Gender: Male DOB: 26-Jun-1966 Age: 55 Y 109 M 21 D Return Phone Number: [phone] (Primary) Address: City/ State/ Zip: [place]  [address] Client  [facility] - Client Client Site [facility] Physician Raoul Pitch, [place] Contact Type Call Who Is Calling Patient / Member / Family / Caregiver Call Type Triage / Clinical Relationship To Patient Self Return Phone Number [phone] (Primary) Chief Complaint Tick Bite Reason for Call Symptomatic / Request for Health Information Initial Comment Caller was bit by tick approx week ago, 0.5 inch circle around bite area. Caller is unsure whether needs to come in. Translation No No Triage Reason Patient declined Nurse Assessment Nurse: Markus Daft, RN, Sherre Poot Date/Time Eilene Ghazi Time): 03/31/2021 8:58:24 AM Confirm and document reason for call. If symptomatic, describe symptoms. ---Caller states he was bit by tick about a week ago. He had 0.5 inch circle around bite area then, and now is spreading about 2 inches. Does the patient have any new or worsening symptoms? ---Yes Will a triage be completed? ---No Select reason for no triage. ---Patient declined Please document clinical information provided and list any resource used. ---He has appt set for this am now. Declined further needs. Disp. Time Eilene Ghazi Time) Disposition Final User 03/31/2021 8:59:46 AM Clinical Call Yes Markus Daft, RN, Sherre Poot

## 2021-03-31 NOTE — Patient Instructions (Signed)
Lyme Disease Lyme disease is an infection that can affect many parts of the body, including the skin, joints, and nervous system. It is a bacterial infection that starts from the bite of an infected tick. Over time, the infection can worsen, and some of the symptoms are similar to the flu. If Lyme disease is not treated, it may cause joint pain, swelling, numbness, problems thinking, fatigue, muscle weakness, and other problems. What are the causes? This condition is caused by bacteria called Borrelia burgdorferi.  You can get Lyme disease by being bitten by an infected tick.  Only black-legged, or Ixodes, ticks that are infected with the bacteria can cause Lyme disease.  The tick must be attached to your skin for a certain period of time to pass along the infection. This is usually 36-48 hours.  Deer often carry infected ticks. What increases the risk? The following factors may make you more likely to develop this condition:  Living in or visiting these areas in the [place] ? [place]. ? The [place]. ? [place].  Spending time in wooded or grassy areas.  Being outdoors with exposed skin.  Camping, gardening, hiking, fishing, hunting, or working outdoors.  Failing to remove a tick from your skin. What are the signs or symptoms? Symptoms of this condition may include:  Chills and fever.  Headache.  Fatigue.  General achiness.  Muscle pain.  Joint pain, often in the knees.  A round, red rash that surrounds the center of the tick bite. The center of the rash may be blood colored or have tiny blisters.  Swollen lymph glands.  Stiff neck.   How is this diagnosed? This condition is diagnosed based on:  Your symptoms and medical history.  A physical exam.  A blood test. How is this treated? The main treatment for this condition is antibiotic medicine, which is usually taken by mouth (orally).  The length of treatment depends on how soon after a  tick bite you begin taking the medicine. In some cases, treatment is necessary for several weeks.  If the infection is severe, antibiotics may need to be given through an IV that is inserted into one of your veins. Follow these instructions at home:  Take over-the-counter and prescription medicines only as told by your health care provider. Finish all antibiotic medicine, even when you start to feel better.  Ask your health care provider about taking a probiotic in between doses of your antibiotic to help avoid an upset stomach or diarrhea.  Check with your health care provider before supplementing your treatment. Many alternative therapies have not been proven and may be harmful to you.  Keep all follow-up visits as told by your health care provider. This is important. How is this prevented? You can become reinfected if you get another tick bite from an infected tick. Take these steps to help prevent an infection:  Cover your skin with light-colored clothing when you are outdoors in the spring and summer months.  Spray clothing and skin with bug spray. The spray should be 20-30% DEET. You can also treat clothing with permethrin, and let it dry before you wear it. Do not apply permethrin directly to your skin. Permethrin can also be used to treat camping gear and boots. Always read and follow the instructions that come with a bug spray or insecticide.  Avoid wooded, grassy, and shaded areas.  Remove yard litter, brush, trash, and plants that attract deer and rodents.  Check yourself for  ticks when you come indoors.  Wash clothing worn each day.  Shower after spending time outdoors.  Check your pets for ticks before they come inside.  If you find a tick attached to your skin: ? Remove it with tweezers. ? Clean your hands and the bite area with rubbing alcohol or soap and water. ? Dispose of the tick by putting it in rubbing alcohol, putting it in a sealed bag or container, or  flushing it down the toilet. ? You may choose to save the tick in a sealed container if you wish for it to be tested at a later time. Pregnant women should take special care to avoid tick bites because it is possible that the infection may be passed along to the fetus.   Contact a health care provider if:  You have symptoms after treatment.  You have removed a tick and want to bring it to your health care provider for testing. Get help right away if:  You have an irregular heartbeat.  You have chest pain.  You have nerve pain.  Your face feels numb.  You develop the following: ? A stiff neck. ? A severe headache. ? Severe nausea and vomiting. ? Sensitivity to light. Summary  Lyme disease is an infection that can affect many parts of the body, including the skin, joints, and nervous system.  This condition is caused by bacteria called Borrelia burgdorferi.  You can get Lyme disease by being bitten by an infected tick.  The main treatment for this condition is antibiotic medicine. This information is not intended to replace advice given to you by your health care provider. Make sure you discuss any questions you have with your health care provider. Document Revised: 03/03/2019 Document Reviewed: 01/26/2019 Elsevier Patient Education  2021 [place] Spotted Fever [facility] spotted fever is a bacterial infection that spreads to people through contact with certain ticks. The illness causes flu-like symptoms and a reddish-purple rash. The illness does not spread from person to person (is not contagious). When this condition is not treated right away, it can quickly become very serious, and can sometimes lead to long-term (chronic) health problems or even death. What are the causes? This condition is caused by a type of bacteria (Rickettsia rickettsii) that is carried by [place] dog ticks, brown dog ticks, and [employer] wood ticks. The infection  spreads through:  A bite from an infected tick. Tick bites are usually painless, and they frequently are not noticed.  Infected tick blood, body fluids, or feces that get into the body through damaged skin, such as a small cut or sore. This could happen while removing a tick from a pet or from another person. What increases the risk? The following factors may make you more likely to develop this condition:  Spending a lot of time outdoors, especially in rural areas or areas with long grass.  Spending time outdoors during warm weather. Ticks are most active during warm weather. What are the signs or symptoms? Symptoms of this condition include:  Fever.  Muscle aches.  Headache.  Nausea.  Vomiting.  Poor appetite.  Abdominal pain.  A reddish-purple rash. ? This usually appears 2-5 days after the first symptoms begin. ? The rash often starts on the wrists, forearms, and ankles. It may then spread to the palms, the bottom of the feet, legs, and trunk. Symptoms may develop 2-14 days after a tick bite.   How is this diagnosed? This condition  is diagnosed based on:  Your medical history.  A physical exam.  Blood tests.  Whether you have recently been bitten by a tick or spent time in areas where: ? Ticks are common. ? [facility] spotted fever is common. How is this treated? This condition is treated with antibiotic medicines. It is important to begin treatment right away. In some cases, your health care provider may begin treatment before the diagnosis is confirmed. If your symptoms are severe, you may need to be treated in the hospital where you can get antibiotics and be monitored during treatment. Follow these instructions at home:  Take over-the-counter and prescription medicines only as told by your health care provider.  Take your antibiotic medicine as told by your health care provider. Do not stop taking the antibiotic even if you start to feel better.  Rest  as much as possible until you feel better. Return to your normal activities as told by your health care provider.  Drink enough fluid to keep your urine pale yellow.  Keep all follow-up visits as told by your health care provider. This is important. Contact a health care provider if:  You have a rash that gets increasingly red or swollen.  You have fluid draining from any areas of your rash. Get help right away if:  You develop a fever after being bitten by a tick.  You develop a rash 2-5 days after experiencing flu-like symptoms.  You have chest pain.  You have shortness of breath.  You have a severe headache.  You have jerky movements you cannot control (seizure).  You have severe abdominal pain.  You feel confused.  You bruise easily.  You have bleeding from your gums.  You have blood in your stool (feces).  You have trouble controlling when you urinate or have bowel movements (incontinence).  You have vision problems.  You have numbness or tingling in your arms or legs. Summary  [facility] spotted fever is a bacterial infection that spreads to people through contact with certain ticks.  When this condition is not treated right away, it can quickly become very serious, and can sometimes lead to long-term (chronic) health problems or even death.  You are more likely to develop this infection if you spend time outdoors in warm weather and in areas with tall grass.  Symptoms of this condition include fever, headache, nausea, vomiting, abdominal pain, muscle aches, and a reddish-purple rash that usually appears 2-5 days after a fever.  This condition is treated with antibiotic medicines. This information is not intended to replace advice given to you by your health care provider. Make sure you discuss any questions you have with your health care provider. Document Revised: 11/12/2017 Document Reviewed: 02/04/2017 Elsevier Patient Education  [place].

## 2021-04-02 LAB — ROCKY MTN SPOTTED FVR ABS PNL(IGG+IGM)
RMSF IgG: NOT DETECTED
RMSF IgM: NOT DETECTED

## 2021-04-02 LAB — B. BURGDORFI ANTIBODIES: B burgdorferi Ab IgG+IgM: <0.9 index

## 2021-04-11 ENCOUNTER — Ambulatory Visit: Payer: 59 | Admitting: Gastroenterology

## 2021-04-11 ENCOUNTER — Telehealth: Payer: Self-pay | Admitting: *Deleted

## 2021-04-11 ENCOUNTER — Other Ambulatory Visit: Payer: Self-pay

## 2021-04-11 DIAGNOSIS — Z85038 Personal history of other malignant neoplasm of large intestine: Secondary | ICD-10-CM

## 2021-04-11 MED ORDER — ONDANSETRON HCL 4 MG PO TABS: 4.0000 mg | ORAL_TABLET | ORAL | 0 refills | Status: DC | PRN

## 2021-04-11 NOTE — Progress Notes (Signed)
Pt arrived for colonoscopy and had not completed suprep.  Pt states that he could not tolerate.  Per Dr. Loletha Carrow, will reschedule pt for first available appointment with Miralax prep.

## 2021-04-11 NOTE — Telephone Encounter (Signed)
Rescheduled pt for colonoscopy on June 13th at 10:30am.  Prep instructions for Miralax with dulcolax reviewed with pt.

## 2021-04-25 ENCOUNTER — Encounter: Payer: Self-pay | Admitting: Family Medicine

## 2021-04-25 NOTE — Telephone Encounter (Signed)
Lvm for pt to CB regarding mychart

## 2021-04-25 NOTE — Telephone Encounter (Signed)
Patient is calling in stating that he ended up going to [facility] in [facility], they put a balloon in the nose and will follow up with them on Monday to cauterize it if it isnt any better.

## 2021-04-25 NOTE — Telephone Encounter (Signed)
Noted  

## 2021-04-25 NOTE — Telephone Encounter (Signed)
Nurse Assessment Nurse: Hassell Done, RN, Melanie Date/Time Eilene Ghazi Time): 04/25/2021 1:40:07 PM Confirm and document reason for call. If symptomatic, describe symptoms. ---Caller states he went to the ER last night for bloody nose and was told he needed to See ENT and pt cant get ahold of them yet. Woke up this morning with bleeding and it will not stop now. He has a clip on the end of his nose now and it was bleeding so much it was going into his nose. Does the patient have any new or worsening symptoms? ---Yes Will a triage be completed? ---Yes Related visit to physician within the last 2 weeks? ---Yes Does the PT have any chronic conditions? (i.e. diabetes, asthma, this includes High risk factors for pregnancy, etc.) ---Yes List chronic conditions. ---hypertension Is this a behavioral health or substance abuse call? ---No Guidelines Guideline Title Affirmed Question Affirmed Notes Nurse Date/Time (Eastern Time) Nosebleed [1] Bleeding present > 30 minutes AND [2] using correct method of direct pressure Hassell Done, RN, Threasa Beards 04/25/2021 1:44:01 PM PLEASE NOTE: All timestamps contained within this report are represented as [place] Standard Time. CONFIDENTIALTY NOTICE: This fax transmission is intended only for the addressee. It contains information that is legally privileged, confidential or otherwise protected from use or disclosure. If you are not the intended recipient, you are strictly prohibited from reviewing, disclosing, copying using or disseminating any of this information or taking any action in reliance on or regarding this information. If you have received this fax in error, please notify us immediately by telephone so that we can arrange for its return to [place]. Phone: [phone], Toll-Free: [phone], Fax: [phone] Page: 2 of 2 Call Id: [id number] Guidelines Guideline Title Affirmed Question Affirmed Notes Nurse Date/Time Eilene Ghazi Time) Blood Pressure - High [2]  Systolic BP >= 820 OR Diastolic >= 601 AND [5] cardiac or neurologic symptoms (e.g., chest pain, difficulty breathing, unsteady gait, blurred vision) Hassell Done, RN, Threasa Beards 04/25/2021 1:50:14 PM Disp. Time Eilene Ghazi Time) Disposition Final User 04/25/2021 1:36:26 PM Send to Urgent Tawanna Cooler 04/25/2021 1:49:14 PM Go to ED Now Hassell Done, RN, Melanie 04/25/2021 1:52:46 PM Go to ED Now Yes Hassell Done, RN, Donnajean Lopes Disagree/Comply Comply Caller Understands Yes PreDisposition Call Doctor Care Advice Given Per Guideline GO TO ED NOW: * You need to be seen in the Emergency Department. * Go to the ED at ___________ [place]: CARE ADVICE given per Nosebleed (Adult) guideline. GO TO ED NOW: * You need to be seen in the Emergency Department. CALL EMS 911 IF: * Passes out or faints * Becomes confused * Becomes too weak to stand * You become worse CARE ADVICE given per High Blood Pressure (Adult) guideline. NOTE TO TRIAGER - DRIVING: * Another adult should drive. Referrals GO TO FACILITY UNDECIDED GO TO FACILITY OTHER - SPECIF

## 2021-05-05 ENCOUNTER — Telehealth: Payer: Self-pay | Admitting: Gastroenterology

## 2021-05-05 ENCOUNTER — Encounter: Payer: 59 | Admitting: Gastroenterology

## 2021-05-05 NOTE — Telephone Encounter (Signed)
For documentation purposes, patient in ED on June 2 and 3 for nosebleed requiring packing.  Patient reschedule his colonoscopy to August 30.

## 2021-05-05 NOTE — Telephone Encounter (Signed)
Good morning,  Pt called cancelled colon appt scheduled for this morning at 10:30am with Dr. Loletha Carrow.   Patient states he went to the ED for high blood /nose bleed. Patient states he didn't prep.  Patient will call back to reschedule when he feels better.  Thank you

## 2021-05-30 ENCOUNTER — Encounter: Payer: Self-pay | Admitting: Family Medicine

## 2021-05-30 ENCOUNTER — Other Ambulatory Visit: Payer: Self-pay

## 2021-05-30 ENCOUNTER — Ambulatory Visit (INDEPENDENT_AMBULATORY_CARE_PROVIDER_SITE_OTHER): Payer: 59 | Admitting: Family Medicine

## 2021-05-30 VITALS — BP 130/88 | HR 59 | Temp 97.8°F | Ht 67.0 in | Wt 175.0 lb

## 2021-05-30 DIAGNOSIS — Z8249 Family history of ischemic heart disease and other diseases of the circulatory system: Secondary | ICD-10-CM | POA: Diagnosis not present

## 2021-05-30 DIAGNOSIS — I10 Essential (primary) hypertension: Secondary | ICD-10-CM | POA: Diagnosis not present

## 2021-05-30 DIAGNOSIS — E663 Overweight: Secondary | ICD-10-CM

## 2021-05-30 MED ORDER — LOSARTAN POTASSIUM 50 MG PO TABS: 50.0000 mg | ORAL_TABLET | Freq: Every day | ORAL | 1 refills | Status: DC

## 2021-05-30 NOTE — Progress Notes (Signed)
This visit occurred during the SARS-CoV-2 public health emergency.  Safety protocols were in place, including screening questions prior to the visit, additional usage of staff PPE, and extensive cleaning of exam room while observing appropriate contact time as indicated for disinfecting solutions.    Patient ID: [name], male  DOB: Apr 05, 1966, 55 y.o.   MRN: [id number] Patient Care Team    Relationship Specialty Notifications Start End  Ma Hillock, DO PCP - General Family Medicine  02/10/16   Doran Stabler, MD Consulting Physician Gastroenterology  04/19/18   Ladell Pier, MD Consulting Physician Oncology  05/03/19     Chief Complaint  Patient presents with   Hypertension    Pt is not fasting;     Subjective: [name] is a 55 y.o. male present for cmc Hypertension/overweight/former smoker/fhx heart disease in mother:  Pt reports he is actually only taking losartan 25 mg daily.  He was supposed to increase from losartan 50 mg to 75-100 mg.  He reports home blood pressure readings range anywhere between 782N-562 systolics. Tried: amlodipine because it caused him stomach upset. Lisinopril caused him a  Headache. Patient denies chest pain, shortness of breath, dizziness or lower extremity edema.  He reports he has not been called to schedule his cardiac CT either times it had been ordered for him over the last year. Labs utd Diet: no routine Exercise: no routine RF: HTN, former smoker, Fhx    Depression screen [facility] 2/9 01/10/2021 05/02/2019 04/19/2018 02/10/2017  Decreased Interest 0 0 0 0  Down, Depressed, Hopeless 0 0 0 0  PHQ - 2 Score 0 0 0 0   No flowsheet data found.      Fall Risk  04/19/2018 02/10/2017  Falls in the past year? No No    Immunization History  Administered Date(s) Administered   Influenza-Unspecified 07/24/2018   Tdap 11/23/2010, 01/10/2021   Zoster Recombinat (Shingrix) 05/02/2019, 08/07/2019    Past Medical History:  Diagnosis  Date   Allergy    mild- uses honey    Chicken pox    Chronic kidney disease    kidney stone    Colon cancer ([place])    colon   COVID 11/2020   GERD (gastroesophageal reflux disease)    diet related    Hepatitis B    Hypertension    Lymphadenitis    Pneumothorax    Allergies  Allergen Reactions   Amlodipine Nausea And Vomiting    Stomach upset    Lisinopril Other (See Comments)    headache   Past Surgical History:  Procedure Laterality Date   COLON SURGERY  2008   COLONOSCOPY     PARTIAL COLECTOMY     colon caner   POLYPECTOMY     Family History  Problem Relation Age of Onset   Arthritis Mother    Cancer Mother    COPD Mother    Heart disease Mother    Colon cancer Mother 30   Colon polyps Daughter    Colon cancer Maternal Uncle    Colon cancer Cousin    Esophageal cancer Neg Hx    Pancreatic cancer Neg Hx    Prostate cancer Neg Hx    Rectal cancer Neg Hx    Stomach cancer Neg Hx    Social History   Social History Narrative   Single, 1 child.   Self-employed, college education.   Drinks caffeinated beverages, uses herbal remedies.   Wears his seatbelt, exercises  greater than 3 times a week.   Smoke detector in the home, firearms in the home in a locked cabinet.   Feels safe in his relationships.    Allergies as of 05/30/2021       Reactions   Amlodipine Nausea And Vomiting   Stomach upset   Lisinopril Other (See Comments)   headache        Medication List        Accurate as of May 30, 2021  5:27 PM. If you have any questions, ask your nurse or doctor.          STOP taking these medications    doxycycline 100 MG tablet Commonly known as: VIBRA-TABS Stopped by: Howard Pouch, DO       TAKE these medications    losartan 50 MG tablet Commonly known as: Cozaar Take 1 tablet (50 mg total) by mouth daily. What changed:  medication strength how much to take Changed by: Howard Pouch, DO   OLIVE LEAF EXTRACT PO Take by mouth.    ondansetron 4 MG tablet Commonly known as: ZOFRAN Take 1 tablet (4 mg total) by mouth as needed for nausea or vomiting. Per colonoscopy instructions   vitamin C 1000 MG tablet Take 1,000 mg by mouth daily.   VITAMIN D-3 PO Take by mouth.       All past medical history, surgical history, allergies, family history, immunizations andmedications were updated in the EMR today and reviewed under the history and medication portions of their EMR.      No results found.   ROS: 14 pt review of systems performed and negative (unless mentioned in an HPI)  Objective: BP 130/88 (BP Location: Right Arm, Cuff Size: Normal)   Pulse (!) 59   Temp 97.8 F (36.6 C) (Oral)   Ht 5\' 7"  (1.702 m)   Wt 175 lb (79.4 kg)   SpO2 98%   BMI 27.41 kg/m  Gen: Afebrile. No acute distress. Nontoxic. Pleasant male.  HENT: AT. [place].  Eyes:Pupils Equal Round Reactive to light, Extraocular movements intact,  Conjunctiva without redness, discharge or icterus. CV: RRR no murnur, no edema Chest: CTAB, no wheeze or crackles Neuro: Normal gait. PERLA. EOMi. Alert. Orientedx3 Psych: Normal affect, dress and demeanor. Normal speech. Normal thought content and judgment.  No results found.  Assessment/plan: [name] is a 55 y.o. male present for CPE/cmc Essential hypertension/Overweight (BMI 25.0-29.9)/Family history of heart disease/former smoker -Encouraged him to continue his losartan at 50 mg a day.  He is concerned about afternoon fatigue, but is not having low blood pressures during that time.  Therefore the afternoon fatigue is not related to his blood pressure medicine. -He is aware blood pressure goal is <130/80. - heart healthy diet encouraged.  - UTD - Cardiac CT - self pay ordered last visit, pt did not get scheduled.  This has been the second time this order has been placed and patient has not been called to schedule.  Placed again today for the third time and changed the location to [place]. He understands it is $99 out of pocket cost. He is desiring cardiac CT d/t fhx and his HTN.  - Consider AAA screen at 38.  -LDL has been at goal less than 100.  CAD appreciated on cardiac CT will consider goal to be <70 and discuss statins. -Follow-up 5.5 months   *Return in about 6 months (around 11/17/2021).  Orders Placed This Encounter  Procedures   CT CARDIAC SCORING (  SELF PAY ONLY)     Meds ordered this encounter  Medications   losartan (COZAAR) 50 MG tablet    Sig: Take 1 tablet (50 mg total) by mouth daily.    Dispense:  90 tablet    Refill:  1    Discontinue ALL other scripts/doses. Thanks.    Referral Orders  No referral(s) requested today     Note is dictated utilizing voice recognition software. Although note has been proof read prior to signing, occasional typographical errors still can be missed. If any questions arise, please do not hesitate to call for verification.  Electronically signed by: Howard Pouch, DO [place]

## 2021-05-30 NOTE — Patient Instructions (Signed)
Take losartan 50 mg a day.  We will try on that cardiac CT again- but at [place] location.

## 2021-07-22 ENCOUNTER — Other Ambulatory Visit: Payer: Self-pay

## 2021-07-22 ENCOUNTER — Telehealth: Payer: Self-pay

## 2021-07-22 ENCOUNTER — Telehealth: Payer: Self-pay | Admitting: Genetic Counselor

## 2021-07-22 ENCOUNTER — Telehealth: Payer: Self-pay | Admitting: Family Medicine

## 2021-07-22 ENCOUNTER — Encounter: Payer: Self-pay | Admitting: Gastroenterology

## 2021-07-22 ENCOUNTER — Ambulatory Visit (AMBULATORY_SURGERY_CENTER): Payer: 59 | Admitting: Gastroenterology

## 2021-07-22 VITALS — BP 181/112 | HR 63 | Temp 97.5°F | Resp 10 | Ht 67.0 in | Wt 175.0 lb

## 2021-07-22 DIAGNOSIS — Z85038 Personal history of other malignant neoplasm of large intestine: Secondary | ICD-10-CM | POA: Diagnosis present

## 2021-07-22 DIAGNOSIS — D120 Benign neoplasm of cecum: Secondary | ICD-10-CM | POA: Diagnosis not present

## 2021-07-22 DIAGNOSIS — Z86010 Personal history of colonic polyps: Secondary | ICD-10-CM

## 2021-07-22 DIAGNOSIS — K635 Polyp of colon: Secondary | ICD-10-CM | POA: Diagnosis not present

## 2021-07-22 DIAGNOSIS — Z800 Family history of malignant neoplasm of digestive organs: Secondary | ICD-10-CM

## 2021-07-22 MED ORDER — SODIUM CHLORIDE 0.9 % IV SOLN: 500.0000 mL | Freq: Once | INTRAVENOUS | Status: DC

## 2021-07-22 NOTE — Progress Notes (Signed)
VS taken by C.W. 

## 2021-07-22 NOTE — Patient Instructions (Signed)
Handout given for polyps.  YOU HAD AN ENDOSCOPIC PROCEDURE TODAY AT THE [facility]:   Refer to the procedure report that was given to you for any specific questions about what was found during the examination.  If the procedure report does not answer your questions, please call your gastroenterologist to clarify.  If you requested that your care partner not be given the details of your procedure findings, then the procedure report has been included in a sealed envelope for you to review at your convenience later.  YOU SHOULD EXPECT: Some feelings of bloating in the abdomen. Passage of more gas than usual.  Walking can help get rid of the air that was put into your GI tract during the procedure and reduce the bloating. If you had a lower endoscopy (such as a colonoscopy or flexible sigmoidoscopy) you may notice spotting of blood in your stool or on the toilet paper. If you underwent a bowel prep for your procedure, you may not have a normal bowel movement for a few days.  Please Note:  You might notice some irritation and congestion in your nose or some drainage.  This is from the oxygen used during your procedure.  There is no need for concern and it should clear up in a day or so.  SYMPTOMS TO REPORT IMMEDIATELY:   Following lower endoscopy (colonoscopy or flexible sigmoidoscopy):  Excessive amounts of blood in the stool  Significant tenderness or worsening of abdominal pains  Swelling of the abdomen that is new, acute  Fever of 100F or higher  For urgent or emergent issues, a gastroenterologist can be reached at any hour by calling [phone]. Do not use MyChart messaging for urgent concerns.    DIET:  We do recommend a small meal at first, but then you may proceed to your regular diet.  Drink plenty of fluids but you should avoid alcoholic beverages for 24 hours.  ACTIVITY:  You should plan to take it easy for the rest of today and you should NOT DRIVE or use heavy  machinery until tomorrow (because of the sedation medicines used during the test).    FOLLOW UP: Our staff will call the number listed on your records 48-72 hours following your procedure to check on you and address any questions or concerns that you may have regarding the information given to you following your procedure. If we do not reach you, we will leave a message.  We will attempt to reach you two times.  During this call, we will ask if you have developed any symptoms of COVID 19. If you develop any symptoms (ie: fever, flu-like symptoms, shortness of breath, cough etc.) before then, please call [phone].  If you test positive for Covid 19 in the 2 weeks post procedure, please call and report this information to us.    If any biopsies were taken you will be contacted by phone or by letter within the next 1-3 weeks.  Please call us at [phone] if you have not heard about the biopsies in 3 weeks.    SIGNATURES/CONFIDENTIALITY: You and/or your care partner have signed paperwork which will be entered into your electronic medical record.  These signatures attest to the fact that that the information above on your After Visit Summary has been reviewed and is understood.  Full responsibility of the confidentiality of this discharge information lies with you and/or your care-partner. 

## 2021-07-22 NOTE — Telephone Encounter (Signed)
Please contact patient: His blood pressure was significantly elevated at his colonoscopy.  His gastroenterologist has encouraged him to follow-up with [place].  Please remind patient he is supposed to take losartan 50 mg a day, that is a whole tab.  If his blood pressures at home are greater than 130/80 when taking a whole tab of the medication, then he needs to follow-up immediately so that we can adjust his regimen.

## 2021-07-22 NOTE — Op Note (Signed)
[facility] Patient Name: [name] Procedure Date: 07/22/2021 11:21 AM MRN: [id number] Endoscopist: Mallie Mussel L. Loletha Carrow , MD Age: 55 Referring MD:  Date of Birth: 06/21/1966 Gender: Male Account #: [id number] Procedure:                Colonoscopy Indications:              High risk colon cancer surveillance: Personal                            history of colon cancer (Dx 2008)                           Last colonoscopy 03/2016 with a 22m cecal TA                           History of colon cancer in mother Medicines:                Monitored Anesthesia Care Procedure:                Pre-Anesthesia Assessment:                           - Prior to the procedure, a History and Physical                            was performed, and patient medications and                            allergies were reviewed. The patient's tolerance of                            previous anesthesia was also reviewed. The risks                            and benefits of the procedure and the sedation                            options and risks were discussed with the patient.                            All questions were answered, and informed consent                            was obtained. Prior Anticoagulants: The patient has                            taken no previous anticoagulant or antiplatelet                            agents. ASA Grade Assessment: II - A patient with                            mild systemic disease. After reviewing the risks  and benefits, the patient was deemed in                            satisfactory condition to undergo the procedure.                           After obtaining informed consent, the colonoscope                            was passed under direct vision. Throughout the                            procedure, the patient's blood pressure, pulse, and                            oxygen saturations were monitored continuously. The                             CF [id number] was introduced through the anus                            and advanced to the the terminal ileum, with                            identification of the appendiceal orifice and IC                            valve. The colonoscopy was performed without                            difficulty. The patient tolerated the procedure                            well. The quality of the bowel preparation was good                            after lavage. ( Scattered fibrous debris). The                            terminal ileum, ileocecal valve, appendiceal                            orifice, and rectum were photographed. The bowel                            preparation used was Miralax (patient was unable to                            tolerate Suprep for previously-scheduled                            colonoscopy 04/11/21). Scope In: 11:41:13 AM Scope Out: 11:58:06 AM Scope Withdrawal Time: 0 hours 13 minutes 31 seconds  Total Procedure Duration: 0 hours 16 minutes  53 seconds  Findings:                 The digital rectal exam findings include palpable                            anastomosis with pelvic fibrosis.                           A 2 mm polyp was found in the cecum. The polyp was                            flat. The polyp was removed with a cold biopsy                            forceps. Resection and retrieval were complete.                           A 4 mm polyp was found in the ileocecal valve. The                            polyp was sessile. The polyp was removed with a                            cold snare. Resection and retrieval were complete.                           There was evidence of a prior end-to-end                            colo-rectal anastomosis in the distal rectum. This                            was patent and was characterized by healthy                            appearing mucosa and visible sutures.                            Retroflexion in the rectum was not performed due to                            anatomy.                           The exam was otherwise without abnormality. Complications:            No immediate complications. Estimated Blood Loss:     Estimated blood loss was minimal. 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                           -  One 2 mm polyp in the cecum, removed with a cold                            biopsy forceps. Resected and retrieved.                           - One 4 mm polyp at the ileocecal valve, removed                            with a cold snare. Resected and retrieved.                           - Patent end-to-end colo-rectal anastomosis,                            characterized by healthy appearing mucosa and                            visible sutures.                           - The examination was otherwise normal. Recommendation:           - Patient has a contact number available for                            emergencies. The signs and symptoms of potential                            delayed complications were discussed with the                            patient. Return to normal activities tomorrow.                            Written discharge instructions were provided to the                            patient.                           - Resume previous diet.                           - Continue present medications.                           - Await pathology results.                           - Repeat colonoscopy in 5 years for surveillance.                           - Refer to a Retail buyer. (was referred  after last colonoscopy, no visit/note from that                            clinic in chart, patient does not recall) Mallie Mussel L. Loletha Carrow, MD 07/22/2021 12:09:50 PM This report has been signed electronically.

## 2021-07-22 NOTE — Progress Notes (Signed)
History and Physical:  This patient presents for endoscopic testing for: Encounter Diagnoses  Name Primary?   H/O colon cancer, stage III Yes   Personal history of colonic polyps    Family history of malignant neoplasm of gastrointestinal tract    Patient also has a Hx of CRC and polyps (details will be in colonoscopy report) Patient without complaints today    Past Medical History: Past Medical History:  Diagnosis Date   Allergy    mild- uses honey    Chicken pox    Chronic kidney disease    kidney stone    Colon cancer ([place])    colon   COVID 11/2020   GERD (gastroesophageal reflux disease)    diet related    Hepatitis B    Hypertension    Lymphadenitis    Pneumothorax      Past Surgical History: Past Surgical History:  Procedure Laterality Date   COLON SURGERY  2008   COLONOSCOPY     PARTIAL COLECTOMY     colon caner   POLYPECTOMY      Allergies: Allergies  Allergen Reactions   Amlodipine Nausea And Vomiting    Stomach upset    Lisinopril Other (See Comments)    headache    Outpatient Meds: Current Outpatient Medications  Medication Sig Dispense Refill   Ascorbic Acid (VITAMIN C) 1000 MG tablet Take 1,000 mg by mouth daily.     Cholecalciferol (VITAMIN D-3 PO) Take by mouth.     losartan (COZAAR) 50 MG tablet Take 1 tablet (50 mg total) by mouth daily. 90 tablet 1   OLIVE LEAF EXTRACT PO Take by mouth.     ondansetron (ZOFRAN) 4 MG tablet Take 1 tablet (4 mg total) by mouth as needed for nausea or vomiting. Per colonoscopy instructions 2 tablet 0   Current Facility-Administered Medications  Medication Dose Route Frequency Provider Last Rate Last Admin   0.9 %  sodium chloride infusion  500 mL Intravenous Once Nelida Meuse III, MD          ___________________________________________________________________ Objective   Exam:  BP (!) 184/92   Pulse 61   Temp (!) 97.5 F (36.4 C)   Ht '5\' 7"'$  (1.702 m)   Wt 175 lb (79.4 kg)   SpO2 99%    BMI 27.41 kg/m   CV: RRR without murmur, S1/S2 Resp: clear to auscultation bilaterally, normal RR and effort noted GI: soft, no tenderness, with active bowel sounds.   Assessment: Encounter Diagnoses  Name Primary?   H/O colon cancer, stage III Yes   Personal history of colonic polyps    Family history of malignant neoplasm of gastrointestinal tract      Plan: Colonoscopy  The benefits and risks of the planned procedure were described in detail with the patient or (when appropriate) their health care proxy.  Risks were outlined as including, but not limited to, bleeding, infection, perforation, adverse medication reaction leading to cardiac or pulmonary decompensation, pancreatitis (if ERCP).  The limitation of incomplete mucosal visualization was also discussed.  No guarantees or warranties were given.    The patient is appropriate for an endoscopic procedure in the ambulatory setting.   - Wilfrid Lund, MD

## 2021-07-22 NOTE — Telephone Encounter (Signed)
Spoke with pt regarding provider instructions.   

## 2021-07-22 NOTE — Telephone Encounter (Signed)
Scheduled appt per 8/30 sch msg. Pt aware of appt date and time.

## 2021-07-22 NOTE — Telephone Encounter (Signed)
-----   Message from Nelida Meuse III, MD sent at 07/22/2021  1:03 PM EDT ----- Please send a referral to genetic counseling at [facility] for personal and family history of colon cancer  Of note, this patient was referred to the clinic in the past, but there is no record of him having been seen there, no indication why.  Patient does not recall.

## 2021-07-22 NOTE — Progress Notes (Signed)
1210--Report from Pollyann Kennedy, RN, pt's bp has been elevated during the procedure and continues to be elevated, Dr Loletha Carrow tells pt to check blood pressure at home 2 times per day and report to Dr Raoul Pitch, pt reports he has only been taking half of his bp pill because he is getting dizzy/fatigued around 3 pm every day.  Pt states Dr Raoul Pitch is aware and she has not changed anything at this point.  Pt instructed to f/u with PCP to do bp checks and to manage his fatigue/ find cause or change bp med if that might help.  Pt and gf verb understanding and deny any c/o or questions.

## 2021-07-22 NOTE — Progress Notes (Signed)
Sedate, gd SR's, VSS, report to RN 

## 2021-07-22 NOTE — Progress Notes (Signed)
Called to room to assist during endoscopic procedure.  Patient ID and intended procedure confirmed with present staff. Received instructions for my participation in the procedure from the performing physician.  

## 2021-07-22 NOTE — Telephone Encounter (Signed)
Ambulatory referral to Genetics in epic.

## 2021-07-24 ENCOUNTER — Telehealth: Payer: Self-pay | Admitting: Gastroenterology

## 2021-07-24 ENCOUNTER — Telehealth: Payer: Self-pay | Admitting: *Deleted

## 2021-07-24 NOTE — Telephone Encounter (Signed)
Message left

## 2021-07-24 NOTE — Telephone Encounter (Signed)
Spoke with patient, advised that we have not received his pathology report yet. Advised that this can take up to a week before we get the results. He is aware that once we receive the results and Dr. Loletha Carrow reviews he will be notified. Pt is aware that he may receive the results via my chart prior to Dr. Loletha Carrow. Pt verbalized understanding of all information and had no concerns at the end of the call.

## 2021-07-24 NOTE — Telephone Encounter (Signed)
  Follow up Call-  Call back number 07/22/2021  Post procedure Call Back phone  # [phone]  Permission to leave phone message Yes  Some recent data might be hidden     Patient questions:  Do you have a fever, pain , or abdominal swelling? No. Pain Score  0 *  Have you tolerated food without any problems? Yes.    Have you been able to return to your normal activities? Yes.    Do you have any questions about your discharge instructions: Diet   No. Medications  No. Follow up visit  No.  Do you have questions or concerns about your Care? No.  Actions: * If pain score is 4 or above: No action needed, pain <4.  Have you developed a fever since your procedure? no  2.   Have you had an respiratory symptoms (SOB or cough) since your procedure? no  3.   Have you tested positive for COVID 19 since your procedure no  4.   Have you had any family members/close contacts diagnosed with the COVID 19 since your procedure?  no   If yes to any of these questions please route to Joylene John, RN and Joella Prince, RN

## 2021-07-28 ENCOUNTER — Encounter: Payer: Self-pay | Admitting: Gastroenterology

## 2021-07-31 ENCOUNTER — Other Ambulatory Visit: Payer: Self-pay

## 2021-07-31 ENCOUNTER — Ambulatory Visit (HOSPITAL_BASED_OUTPATIENT_CLINIC_OR_DEPARTMENT_OTHER)
Admission: RE | Admit: 2021-07-31 | Discharge: 2021-07-31 | Disposition: A | Discharge status: Home / Self Care | Payer: 59 | Source: Ambulatory Visit | Attending: Family Medicine | Admitting: Family Medicine

## 2021-07-31 DIAGNOSIS — E663 Overweight: Secondary | ICD-10-CM | POA: Insufficient documentation

## 2021-07-31 DIAGNOSIS — Z8249 Family history of ischemic heart disease and other diseases of the circulatory system: Secondary | ICD-10-CM

## 2021-07-31 DIAGNOSIS — I10 Essential (primary) hypertension: Secondary | ICD-10-CM | POA: Insufficient documentation

## 2021-08-12 ENCOUNTER — Encounter: Payer: Self-pay | Admitting: Genetic Counselor

## 2021-08-13 ENCOUNTER — Encounter: Payer: Self-pay | Admitting: Genetic Counselor

## 2021-08-13 ENCOUNTER — Inpatient Hospital Stay: Payer: 59 | Attending: Genetic Counselor | Admitting: Genetic Counselor

## 2021-08-13 ENCOUNTER — Other Ambulatory Visit: Payer: Self-pay

## 2021-08-13 ENCOUNTER — Inpatient Hospital Stay: Payer: 59

## 2021-08-13 DIAGNOSIS — Z800 Family history of malignant neoplasm of digestive organs: Secondary | ICD-10-CM | POA: Insufficient documentation

## 2021-08-13 DIAGNOSIS — Z8042 Family history of malignant neoplasm of prostate: Secondary | ICD-10-CM

## 2021-08-13 DIAGNOSIS — Z85038 Personal history of other malignant neoplasm of large intestine: Secondary | ICD-10-CM

## 2021-08-13 NOTE — Progress Notes (Signed)
REFERRING PROVIDER: Doran Stabler, MD [address] [place],  [place] [address]  PRIMARY PROVIDER:  Howard Pouch A, DO  PRIMARY REASON FOR VISIT:  1. Family history of colon cancer   2. Family history of prostate cancer   3. H/O colon cancer, stage III      HISTORY OF PRESENT ILLNESS:   [name], a 55 y.o. male, was seen for a [facility] cancer genetics consultation at the request of Dr. Loletha Carrow due to a personal and family history of colon cancer.  [name] presents to clinic today to discuss the possibility of a hereditary predisposition to cancer, genetic testing, and to further clarify his future cancer risks, as well as potential cancer risks for family members.   In 2008, at the age of 84, [name] was diagnosed with Stage III cancer of the colon. The treatment plan included chemotherapy and surgery.  MSI/IHC was not performed at that time.      CANCER HISTORY:  Oncology History   No history exists.    Past Medical History:  Diagnosis Date   Allergy    mild- uses honey    Chicken pox    Chronic kidney disease    kidney stone    Colon cancer ([place])    colon   COVID 11/2020   Family history of colon cancer    Family history of prostate cancer    GERD (gastroesophageal reflux disease)    diet related    Hepatitis B    Hypertension    Lymphadenitis    Pneumothorax     Past Surgical History:  Procedure Laterality Date   COLON SURGERY  2008   COLONOSCOPY     PARTIAL COLECTOMY     colon caner   POLYPECTOMY      Social History   Socioeconomic History   Marital status: Single    Spouse name: Not on file   Number of children: Not on file   Years of education: Not on file   Highest education level: Not on file  Occupational History   Not on file  Tobacco Use   Smoking status: Former   Smokeless tobacco: Never  Vaping Use   Vaping Use: Never used  Substance and Sexual Activity   Alcohol use: No    Alcohol/week: 0.0 standard drinks    Drug use: No   Sexual activity: Yes    Birth control/protection: None    Comment: Male partner  Other Topics Concern   Not on file  Social History Narrative   Single, 1 child.   Self-employed, college education.   Drinks caffeinated beverages, uses herbal remedies.   Wears his seatbelt, exercises greater than 3 times a week.   Smoke detector in the home, firearms in the home in a locked cabinet.   Feels safe in his relationships.   Social Determinants of Health   Financial Resource Strain: Not on file  Food Insecurity: Not on file  Transportation Needs: Not on file  Physical Activity: Not on file  Stress: Not on file  Social Connections: Not on file     FAMILY HISTORY:  We obtained a detailed, 4-generation family history.  Significant diagnoses are listed below: Family History  Problem Relation Age of Onset   Arthritis Mother    COPD Mother    Heart disease Mother    Colon cancer Mother 27   Colon cancer Maternal Uncle 19       stage IV, d.  50   Prostate cancer Maternal Uncle        dx 72s, d. 47   Colon polyps Daughter    Colon cancer Cousin        Stage IV, dx. 24s, d. 12   Esophageal cancer Neg Hx    Pancreatic cancer Neg Hx    Rectal cancer Neg Hx    Stomach cancer Neg Hx     The patient has a daughter who is cancer free. She reportedly has had a TAH/BSO due to concerns for ovarian cancer.  He had a maternal half brother and sister who are cancer free.  Both parents are deceased.  The patient's father had been unknown to him until adulthood, and therefore little is known about his history.  He had three brothers and a sister. His sister may have had cancer.  His parents are deceased.  The patient's mother had colon cancer at 63 and died at 29. She had three brothers and three sisters.  One brother had colon cancer at 46 and another brother had prostate cancer in his 83's.  A sister had a son with colon cancer, diagnosed at Stage IV.  Both maternal grandparents  are deceased.  [name] is unaware of previous family history of genetic testing for hereditary cancer risks. Patient's maternal ancestors are of Native [place] and [place] descent, and paternal ancestors are of Caucasian descent. There is no reported Ashkenazi Jewish ancestry. There is no known consanguinity.    GENETIC COUNSELING ASSESSMENT: [name] is a 55 y.o. male with a personal and family history of colon cancer which is somewhat suggestive of a hereditary condition such as Lynch syndrome and predisposition to cancer given his young age of onset and the number of people in the family with colon cancer. We, therefore, discussed and recommended the following at today's visit.   DISCUSSION: We discussed that 3 - 7% of colon cancer is hereditary, with most cases associated with Lynch syndrome.  There are other genes that can be associated with hereditary colon cancer syndromes.  These include APC, MUTYH, and others.  We discussed that testing is beneficial for several reasons including knowing how to follow individuals after completing their treatment, and understanding if other family members could be at risk for cancer and allow them to undergo genetic testing.   We reviewed the characteristics, features and inheritance patterns of hereditary cancer syndromes. We also discussed genetic testing, including the appropriate family members to test, the process of testing, insurance coverage and turn-around-time for results. We discussed the implications of a negative, positive, carrier and/or variant of uncertain significant result. We recommended [name] pursue genetic testing for the CancerNext-Expanded+RNA gene panel.   The CancerNext-Expanded gene panel offered by [facility] and includes sequencing and rearrangement analysis for the following 77 genes: AIP, ALK, APC*, ATM*, AXIN2, BAP1, BARD1, BLM, BMPR1A, BRCA1*, BRCA2*, BRIP1*, CDC73, CDH1*, CDK4, CDKN1B, CDKN2A, CHEK2*, CTNNA1, DICER1,  FANCC, FH, FLCN, GALNT12, KIF1B, LZTR1, MAX, MEN1, MET, MLH1*, MSH2*, MSH3, MSH6*, MUTYH*, NBN, NF1*, NF2, NTHL1, PALB2*, PHOX2B, PMS2*, POT1, PRKAR1A, PTCH1, PTEN*, RAD51C*, RAD51D*, RB1, RECQL, RET, SDHA, SDHAF2, SDHB, SDHC, SDHD, SMAD4, SMARCA4, SMARCB1, SMARCE1, STK11, SUFU, TMEM127, TP53*, TSC1, TSC2, VHL and XRCC2 (sequencing and deletion/duplication); EGFR, EGLN1, HOXB13, KIT, MITF, PDGFRA, POLD1, and POLE (sequencing only); EPCAM and GREM1 (deletion/duplication only). DNA and RNA analyses performed for * genes.   Based on [name] personal and family history of cancer, he meets medical criteria for genetic testing. Despite that he  meets criteria, he may still have an out of pocket cost. We discussed that if his out of pocket cost for testing is over $100, the laboratory will call and confirm whether he wants to proceed with testing.  If the out of pocket cost of testing is less than $100 he will be billed by the genetic testing laboratory.   In order to estimate his chance of having a Lynch syndrome MMR mutation, we used statistical models (MMRPro) that consider his personal medical history, family history and ancestry.  Because each model is different, there can be a lot of variability in the risks they give.  Therefore, these numbers must be considered a rough range and not a precise risk of having a Lynch syndrome MMR mutation.  These models estimate that he has approximately a 33.47% chance of having a mutation. Based on this assessment of his family and personal history, genetic testing is recommended.    PLAN: Despite our recommendation, [name] did not wish to pursue genetic testing at today's visit. We understand this decision and remain available to coordinate genetic testing at any time in the future. We, therefore, recommend [name] continue to follow the cancer screening guidelines given by his primary healthcare provider.  Based on [name] family history, we recommended  his daughter and siblings have genetic counseling and testing. [name] will let us know if we can be of any assistance in coordinating genetic counseling and/or testing for this family member.   Lastly, we encouraged Mr. Derflinger to remain in contact with cancer genetics annually so that we can continuously update the family history and inform him of any changes in cancer genetics and testing that may be of benefit for this family.   Mr. Lauricella questions were answered to his satisfaction today. Our contact information was provided should additional questions or concerns arise. Thank you for the referral and allowing [place] to share in the care of your patient.   Chandlor Noecker P. Florene Glen, [place], [place] Licensed, Insurance risk surveyor [email or web address] phone: [phone]  The patient was seen for a total of 45 minutes in face-to-face genetic counseling.  The patient was seen alone.  This patient was discussed with Drs. Magrinat, Lindi Adie and/or Burr Medico who agrees with the above.    _______________________________________________________________________ For Office Staff:  Number of people involved in session: 1 Was an Intern/ student involved with case: yes

## 2022-01-13 ENCOUNTER — Other Ambulatory Visit: Payer: Self-pay

## 2022-01-14 ENCOUNTER — Ambulatory Visit (INDEPENDENT_AMBULATORY_CARE_PROVIDER_SITE_OTHER): Payer: 59 | Admitting: Family Medicine

## 2022-01-14 ENCOUNTER — Encounter: Payer: Self-pay | Admitting: Family Medicine

## 2022-01-14 VITALS — BP 181/98 | HR 65 | Temp 97.9°F | Ht 67.32 in | Wt 174.8 lb

## 2022-01-14 DIAGNOSIS — E663 Overweight: Secondary | ICD-10-CM

## 2022-01-14 DIAGNOSIS — Z125 Encounter for screening for malignant neoplasm of prostate: Secondary | ICD-10-CM | POA: Diagnosis not present

## 2022-01-14 DIAGNOSIS — Z8249 Family history of ischemic heart disease and other diseases of the circulatory system: Secondary | ICD-10-CM | POA: Diagnosis not present

## 2022-01-14 DIAGNOSIS — Z8042 Family history of malignant neoplasm of prostate: Secondary | ICD-10-CM

## 2022-01-14 DIAGNOSIS — Z85038 Personal history of other malignant neoplasm of large intestine: Secondary | ICD-10-CM

## 2022-01-14 DIAGNOSIS — I10 Essential (primary) hypertension: Secondary | ICD-10-CM

## 2022-01-14 DIAGNOSIS — Z131 Encounter for screening for diabetes mellitus: Secondary | ICD-10-CM

## 2022-01-14 DIAGNOSIS — Z800 Family history of malignant neoplasm of digestive organs: Secondary | ICD-10-CM | POA: Diagnosis not present

## 2022-01-14 DIAGNOSIS — Z0000 Encounter for general adult medical examination without abnormal findings: Secondary | ICD-10-CM

## 2022-01-14 LAB — LIPID PANEL
Cholesterol: 160 mg/dL (ref 0–200)
HDL: 45.3 mg/dL (ref >39.00)
LDL Cholesterol: 103 mg/dL — ABNORMAL HIGH (ref 0–99)
NonHDL: 114.88
Total CHOL/HDL Ratio: 4
Triglycerides: 60 mg/dL (ref 0.0–149.0)
VLDL: 12 mg/dL (ref 0.0–40.0)

## 2022-01-14 LAB — CBC
HCT: 42.4 % (ref 39.0–52.0)
Hemoglobin: 14.3 g/dL (ref 13.0–17.0)
MCHC: 33.6 g/dL (ref 30.0–36.0)
MCV: 87.5 fl (ref 78.0–100.0)
Platelets: 201 10*3/uL (ref 150.0–400.0)
RBC: 4.85 Mil/uL (ref 4.22–5.81)
RDW: 14.1 % (ref 11.5–15.5)
WBC: 4.8 10*3/uL (ref 4.0–10.5)

## 2022-01-14 LAB — HEMOGLOBIN A1C: Hgb A1c MFr Bld: 5.5 % (ref 4.6–6.5)

## 2022-01-14 LAB — COMPREHENSIVE METABOLIC PANEL
ALT: 28 U/L (ref 0–53)
AST: 27 U/L (ref 0–37)
Albumin: 4.1 g/dL (ref 3.5–5.2)
Alkaline Phosphatase: 55 U/L (ref 39–117)
BUN: 18 mg/dL (ref 6–23)
CO2: 33 mEq/L — ABNORMAL HIGH (ref 19–32)
Calcium: 9.1 mg/dL (ref 8.4–10.5)
Chloride: 105 mEq/L (ref 96–112)
Creatinine, Ser: 1.1 mg/dL (ref 0.40–1.50)
GFR: 75.13 mL/min (ref >60.00)
Glucose, Bld: 90 mg/dL (ref 70–99)
Potassium: 3.9 mEq/L (ref 3.5–5.1)
Sodium: 140 mEq/L (ref 135–145)
Total Bilirubin: 0.7 mg/dL (ref 0.2–1.2)
Total Protein: 6.8 g/dL (ref 6.0–8.3)

## 2022-01-14 LAB — PSA: PSA: 0.26 ng/mL (ref 0.10–4.00)

## 2022-01-14 LAB — TSH: TSH: 1.1 u[IU]/mL (ref 0.35–5.50)

## 2022-01-14 MED ORDER — DILTIAZEM HCL ER 180 MG PO CP24: 180.0000 mg | ORAL_CAPSULE | Freq: Every day | ORAL | 0 refills | Status: DC

## 2022-01-14 NOTE — Progress Notes (Signed)
This visit occurred during the SARS-CoV-2 public health emergency.  Safety protocols were in place, including screening questions prior to the visit, additional usage of staff PPE, and extensive cleaning of exam room while observing appropriate contact time as indicated for disinfecting solutions.    Patient ID: [name], male  DOB: 04-24-1966, 56 y.o.   MRN: [id number] Patient Care Team    Relationship Specialty Notifications Start End  Ma Hillock, DO PCP - General Family Medicine  02/10/16   Doran Stabler, MD Consulting Physician Gastroenterology  04/19/18   Ladell Pier, MD Consulting Physician Oncology  05/03/19     Chief Complaint  Patient presents with   Annual Exam    Pt is fasting    Subjective: [name] is a 56 y.o. male present for [place] All past medical history, surgical history, allergies, family history, immunizations, medications and social history were updated in the electronic medical record today. All recent labs, ED visits and hospitalizations within the last year were reviewed.  Health maintenance:  Colonoscopy: 2022, DR. Danis with x2 tubular adenomas. colon resection in 2008 secondary to colon cancer. Mother also had colon cancer. He has a daughter that has had normal screenings.  Follow-up 06/2026 Immunizations: tdap UTD 2022, declines flu shot  Shingrix series completed, covid declined- counseled Infectious disease screening:HIV completed 01/2017 PSA: fhx- yearly screen Lab Results  Component Value Date   PSA 0.36 01/10/2021   PSA 0.36 05/02/2019   PSA 0.45 04/19/2018  , pt was counseled on prostate cancer screenings.  Assistive device: none Oxygen DVV:OHYW Patient has a Dental home. Hospitalizations/ED visits: reviewed  Hypertension/overweight/former smoker/fhx heart disease in mother:  Pt reports he is never taking losartan 50 mg daily.  He feels this makes him have a wet cough in the morning.  He was a prior smoker.  Tried:  amlodipine because it caused him stomach upset. Lisinopril caused him a  Headache. Patient denies chest pain, shortness of breath, dizziness or lower extremity edema.  Diet: no routine Exercise: no routine RF: HTN, former smoker, Fhx  CT CARDIAC SCORING (SELF PAY ONLY) Addendum Date: 07/31/2021   IMPRESSION: 1. No significant incidental noncardiac findings are noted.  IMPRESSION: Coronary calcium score of 0.   Depression screen [facility] 2/9 01/14/2022 01/10/2021 05/02/2019 04/19/2018 02/10/2017  Decreased Interest 0 0 0 0 0  Down, Depressed, Hopeless 0 0 0 0 0  PHQ - 2 Score 0 0 0 0 0  Altered sleeping 0 - - - -  Tired, decreased energy 0 - - - -  Change in appetite 0 - - - -  Feeling bad or failure about yourself  0 - - - -  Trouble concentrating 0 - - - -  Moving slowly or fidgety/restless 0 - - - -  Suicidal thoughts 0 - - - -  PHQ-9 Score 0 - - - -   GAD 7 : Generalized Anxiety Score 01/14/2022  Nervous, Anxious, on Edge 0  Control/stop worrying 0  Worry too much - different things 0  Trouble relaxing 0  Restless 0  Easily annoyed or irritable 0  Afraid - awful might happen 0  Total GAD 7 Score 0        Fall Risk  04/19/2018 02/10/2017  Falls in the past year? No No    Immunization History  Administered Date(s) Administered   Influenza-Unspecified 07/24/2018   Tdap 11/23/2010, 01/10/2021   Zoster Recombinat (Shingrix) 05/02/2019, 08/07/2019   Past Medical  History:  Diagnosis Date   Allergy    mild- uses honey    Chicken pox    Colon cancer ([place])    colon   COVID-19 01/10/2021   11/2020   Family history of colon cancer    Family history of prostate cancer    GERD (gastroesophageal reflux disease)    diet related    Hepatitis B    Hypertension    kidney stone    kidney stone    Lymphadenitis    Pneumothorax    Allergies  Allergen Reactions   Amlodipine Nausea And Vomiting    Stomach upset    Lisinopril Other (See Comments)    headache   Past Surgical  History:  Procedure Laterality Date   COLON SURGERY  2008   COLONOSCOPY     PARTIAL COLECTOMY     colon caner   POLYPECTOMY     Family History  Problem Relation Age of Onset   Arthritis Mother    COPD Mother    Heart disease Mother    Colon cancer Mother 44   Colon cancer Maternal Uncle 40       stage IV, d. 28   Prostate cancer Maternal Uncle        dx 72s, d. 59   Colon polyps Daughter    Colon cancer Cousin        Stage IV, dx. 51s, d. 89   Esophageal cancer Neg Hx    Pancreatic cancer Neg Hx    Rectal cancer Neg Hx    Stomach cancer Neg Hx    Social History   Social History Narrative   Single, 1 child.   Self-employed, college education.   Drinks caffeinated beverages, uses herbal remedies.   Wears his seatbelt, exercises greater than 3 times a week.   Smoke detector in the home, firearms in the home in a locked cabinet.   Feels safe in his relationships.    Allergies as of 01/14/2022       Reactions   Amlodipine Nausea And Vomiting   Stomach upset   Lisinopril Other (See Comments)   headache        Medication List        Accurate as of January 14, 2022  8:59 AM. If you have any questions, ask your nurse or doctor.          STOP taking these medications    losartan 50 MG tablet Commonly known as: Cozaar Stopped by: Howard Pouch, DO   OLIVE LEAF EXTRACT PO Stopped by: Howard Pouch, DO   ondansetron 4 MG tablet Commonly known as: ZOFRAN Stopped by: Howard Pouch, DO   vitamin C 1000 MG tablet Stopped by: Howard Pouch, DO       TAKE these medications    diltiazem 180 MG 24 hr capsule Commonly known as: Dilt-XR Take 1 capsule (180 mg total) by mouth at bedtime. Started by: Howard Pouch, DO   VITAMIN D-3 PO Take by mouth.       All past medical history, surgical history, allergies, family history, immunizations andmedications were updated in the EMR today and reviewed under the history and medication portions of their EMR.      No results found for this or any previous visit (from the past 2160 hour(s)).     ROS 14 pt review of systems performed and negative (unless mentioned in an HPI)  Objective:  BP (!) 181/98    Pulse 65    Temp 97.9  F (36.6 C) (Oral)    Ht 5' 7.32" (1.71 m)    Wt 174 lb 12.8 oz (79.3 kg)    SpO2 100%    BMI 27.12 kg/m   Physical Exam Constitutional:      General: He is not in acute distress.    Appearance: Normal appearance. He is not ill-appearing, toxic-appearing or diaphoretic.  HENT:     Head: Normocephalic and atraumatic.     Right Ear: Tympanic membrane, ear canal and external ear normal. There is no impacted cerumen.     Left Ear: Tympanic membrane, ear canal and external ear normal. There is no impacted cerumen.     Nose: Nose normal. No congestion or rhinorrhea.     Mouth/Throat:     Mouth: Mucous membranes are moist.     Pharynx: Oropharynx is clear. No oropharyngeal exudate or posterior oropharyngeal erythema.  Eyes:     General: No scleral icterus.       Right eye: No discharge.        Left eye: No discharge.     Extraocular Movements: Extraocular movements intact.     Pupils: Pupils are equal, round, and reactive to light.  Cardiovascular:     Rate and Rhythm: Normal rate and regular rhythm.     Pulses: Normal pulses.     Heart sounds: Normal heart sounds. No murmur heard.   No friction rub. No gallop.  Pulmonary:     Effort: Pulmonary effort is normal. No respiratory distress.     Breath sounds: Normal breath sounds. No stridor. No wheezing, rhonchi or rales.  Chest:     Chest wall: No tenderness.  Abdominal:     General: Abdomen is flat. Bowel sounds are normal. There is no distension.     Palpations: Abdomen is soft. There is no mass.     Tenderness: There is no abdominal tenderness. There is no right CVA tenderness, left CVA tenderness, guarding or rebound.     Hernia: No hernia is present.  Musculoskeletal:        General: No swelling or  tenderness. Normal range of motion.     Cervical back: Normal range of motion and neck supple.     Right lower leg: No edema.     Left lower leg: No edema.  Lymphadenopathy:     Cervical: No cervical adenopathy.  Skin:    General: Skin is warm and dry.     Coloration: Skin is not jaundiced.     Findings: No bruising, lesion or rash.  Neurological:     General: No focal deficit present.     Mental Status: He is alert and oriented to person, place, and time. Mental status is at baseline.     Cranial Nerves: No cranial nerve deficit.     Sensory: No sensory deficit.     Motor: No weakness.     Coordination: Coordination normal.     Gait: Gait normal.     Deep Tendon Reflexes: Reflexes normal.  Psychiatric:        Mood and Affect: Mood normal.        Behavior: Behavior normal.        Thought Content: Thought content normal.        Judgment: Judgment normal.    No results found.  Assessment/plan: PASCO MARCHITTO is a 56 y.o. male present for CPE Essential hypertension/Overweight (BMI 25.0-29.9)/Family history of heart disease/former smoker/CAD Not taken med -again.  Noncompliance. Discontinue losartan.  We discussed  that I do not believe his morning cough is due to losartan and is most likely from his smoking history or allergies.  OTC antihistamine and Mucinex would be beneficial. -We discussed he should have his blood pressure medications in his system at least 2 hours before his appointment in the future. -We also discussed pathophysiology of hypertension, genetics etc. -He is aware blood pressure goal is <130/80. - heart healthy diet encouraged.  - cbc, cmp, tsh, lipid collected today - Cardiac CT - ZERO (07/2021) - Consider AAA screen at 65.  -LDL has been at goal less than 100.   -Follow-up 2 weeks on med. For recheck Diabetes mellitus screening - Hemoglobin A1c Prostate cancer screening - PSA Family history of prostate cancer - PSA H/O colon cancer, stage III/Family  history of colon cancer Follows w Dr. Loletha Carrow Encounter for preventive health examination Colonoscopy: 2022, DR. Danis with x2 tubular adenomas. colon resection in 2008 secondary to colon cancer. Mother also had colon cancer. He has a daughter that has had normal screenings.  Immunizations: tdap UTD 2022, declines flu shot  Shingrix series completed, covid declined- counseled Infectious disease screening:HIV completed 01/2017 PSA: fhx- yearly screen Patient was encouraged to exercise greater than 150 minutes a week. Patient was encouraged to choose a diet filled with fresh fruits and vegetables, and lean meats. AVS provided to patient today for education/recommendation on gender specific health and safety maintenance.  Return in about 2 weeks (around 01/28/2022) for bp recheck on meds with provider.  Orders Placed This Encounter  Procedures   CBC   Comprehensive metabolic panel   Hemoglobin A1c   Lipid panel   PSA   TSH   Meds ordered this encounter  Medications   diltiazem (DILT-XR) 180 MG 24 hr capsule    Sig: Take 1 capsule (180 mg total) by mouth at bedtime.    Dispense:  30 capsule    Refill:  0   Referral Orders  No referral(s) requested today   Note is dictated utilizing voice recognition software. Although note has been proof read prior to signing, occasional typographical errors still can be missed. If any questions arise, please do not hesitate to call for verification.  Electronically signed by: Howard Pouch, DO [place]

## 2022-01-14 NOTE — Patient Instructions (Addendum)
Please always take your BP medication prior to visits (at least 2 hours prior please) F/u 2 weeks for BP recheck  Health Maintenance, Male Adopting a healthy lifestyle and getting preventive care are important in promoting health and wellness. Ask your health care provider about: The right schedule for you to have regular tests and exams. Things you can do on your own to prevent diseases and keep yourself healthy. What should I know about diet, weight, and exercise? Eat a healthy diet  Eat a diet that includes plenty of vegetables, fruits, low-fat dairy products, and lean protein. Do not eat a lot of foods that are high in solid fats, added sugars, or sodium. Maintain a healthy weight Body mass index (BMI) is a measurement that can be used to identify possible weight problems. It estimates body fat based on height and weight. Your health care provider can help determine your BMI and help you achieve or maintain a healthy weight. Get regular exercise Get regular exercise. This is one of the most important things you can do for your health. Most adults should: Exercise for at least 150 minutes each week. The exercise should increase your heart rate and make you sweat (moderate-intensity exercise). Do strengthening exercises at least twice a week. This is in addition to the moderate-intensity exercise. Spend less time sitting. Even light physical activity can be beneficial. Watch cholesterol and blood lipids Have your blood tested for lipids and cholesterol at 57 years of age, then have this test every 5 years. You may need to have your cholesterol levels checked more often if: Your lipid or cholesterol levels are high. You are older than 57 years of age. You are at high risk for heart disease. What should I know about cancer screening? Many types of cancers can be detected early and may often be prevented. Depending on your health history and family history, you may need to have cancer  screening at various ages. This may include screening for: Colorectal cancer. Prostate cancer. Skin cancer. Lung cancer. What should I know about heart disease, diabetes, and high blood pressure? Blood pressure and heart disease High blood pressure causes heart disease and increases the risk of stroke. This is more likely to develop in people who have high blood pressure readings or are overweight. Talk with your health care provider about your target blood pressure readings. Have your blood pressure checked: Every 3-5 years if you are 32-46 years of age. Every year if you are 65 years old or older. If you are between the ages of 27 and 4 and are a current or former smoker, ask your health care provider if you should have a one-time screening for abdominal aortic aneurysm (AAA). Diabetes Have regular diabetes screenings. This checks your fasting blood sugar level. Have the screening done: Once every three years after age 68 if you are at a normal weight and have a low risk for diabetes. More often and at a younger age if you are overweight or have a high risk for diabetes. What should I know about preventing infection? Hepatitis B If you have a higher risk for hepatitis B, you should be screened for this virus. Talk with your health care provider to find out if you are at risk for hepatitis B infection. Hepatitis C Blood testing is recommended for: Everyone born from [birth year] through 1966. Anyone with known risk factors for hepatitis C. Sexually transmitted infections (STIs) You should be screened each year for STIs, including gonorrhea and chlamydia,  if: You are sexually active and are younger than 57 years of age. You are older than 57 years of age and your health care provider tells you that you are at risk for this type of infection. Your sexual activity has changed since you were last screened, and you are at increased risk for chlamydia or gonorrhea. Ask your health care provider if  you are at risk. Ask your health care provider about whether you are at high risk for HIV. Your health care provider may recommend a prescription medicine to help prevent HIV infection. If you choose to take medicine to prevent HIV, you should first get tested for HIV. You should then be tested every 3 months for as long as you are taking the medicine. Follow these instructions at home: Alcohol use Do not drink alcohol if your health care provider tells you not to drink. If you drink alcohol: Limit how much you have to 0-2 drinks a day. Know how much alcohol is in your drink. In the [place] one drink equals one 12 oz bottle of beer (355 mL), one 5 oz glass of wine (148 mL), or one 1 oz glass of hard liquor (44 mL). Lifestyle Do not use any products that contain nicotine or tobacco. These products include cigarettes, chewing tobacco, and vaping devices, such as e-cigarettes. If you need help quitting, ask your health care provider. Do not use street drugs. Do not share needles. Ask your health care provider for help if you need support or information about quitting drugs. General instructions Schedule regular health, dental, and eye exams. Stay current with your vaccines. Tell your health care provider if: You often feel depressed. You have ever been abused or do not feel safe at home. Summary Adopting a healthy lifestyle and getting preventive care are important in promoting health and wellness. Follow your health care provider's instructions about healthy diet, exercising, and getting tested or screened for diseases. Follow your health care provider's instructions on monitoring your cholesterol and blood pressure. This information is not intended to replace advice given to you by your health care provider. Make sure you discuss any questions you have with your health care provider. Document Revised: 03/31/2021 Document Reviewed: 03/31/2021 Elsevier Patient Education  [place].

## 2022-01-20 DIAGNOSIS — S52514A Nondisplaced fracture of right radial styloid process, initial encounter for closed fracture: Secondary | ICD-10-CM | POA: Diagnosis not present

## 2022-01-20 DIAGNOSIS — S52571A Other intraarticular fracture of lower end of right radius, initial encounter for closed fracture: Secondary | ICD-10-CM | POA: Diagnosis not present

## 2022-01-20 DIAGNOSIS — W1789XA Other fall from one level to another, initial encounter: Secondary | ICD-10-CM | POA: Diagnosis not present

## 2022-01-20 DIAGNOSIS — M25531 Pain in right wrist: Secondary | ICD-10-CM | POA: Diagnosis not present

## 2022-01-20 DIAGNOSIS — S92001A Unspecified fracture of right calcaneus, initial encounter for closed fracture: Secondary | ICD-10-CM | POA: Diagnosis not present

## 2022-01-20 DIAGNOSIS — M25571 Pain in right ankle and joints of right foot: Secondary | ICD-10-CM | POA: Diagnosis not present

## 2022-01-20 DIAGNOSIS — S52591A Other fractures of lower end of right radius, initial encounter for closed fracture: Secondary | ICD-10-CM | POA: Diagnosis not present

## 2022-01-30 ENCOUNTER — Ambulatory Visit: Payer: 59 | Admitting: Family Medicine

## 2022-02-15 ENCOUNTER — Other Ambulatory Visit: Payer: Self-pay | Admitting: Family Medicine

## 2022-02-23 ENCOUNTER — Encounter: Payer: Self-pay | Admitting: Family Medicine

## 2022-02-23 ENCOUNTER — Ambulatory Visit (INDEPENDENT_AMBULATORY_CARE_PROVIDER_SITE_OTHER): Payer: 59 | Admitting: Family Medicine

## 2022-02-23 VITALS — BP 173/80 | HR 51 | Temp 97.8°F | Ht 67.25 in | Wt 175.0 lb

## 2022-02-23 DIAGNOSIS — E663 Overweight: Secondary | ICD-10-CM | POA: Diagnosis not present

## 2022-02-23 DIAGNOSIS — I10 Essential (primary) hypertension: Secondary | ICD-10-CM | POA: Diagnosis not present

## 2022-02-23 MED ORDER — VALSARTAN 320 MG PO TABS: 160.0000 mg | ORAL_TABLET | Freq: Every day | ORAL | 1 refills | Status: DC

## 2022-02-23 NOTE — Patient Instructions (Addendum)
? ?  Stop Dilt-xr and start diovan in its place.  ?Follow up in 3 mos.  ? ?

## 2022-02-23 NOTE — Progress Notes (Signed)
? ?This visit occurred during the SARS-CoV-2 public health emergency.  Safety protocols were in place, including screening questions prior to the visit, additional usage of staff PPE, and extensive cleaning of exam room while observing appropriate contact time as indicated for disinfecting solutions.  ? ? ?Patient ID: [name], male  DOB: 1966/10/31, 56 y.o.   MRN: [id number] ?Patient Care Team  ?  Relationship Specialty Notifications Start End  ?Ma Hillock, DO PCP - General Family Medicine  02/10/16   ?Doran Stabler, MD Consulting Physician Gastroenterology  04/19/18   ?Ladell Pier, MD Consulting Physician Oncology  05/03/19   ? ? ?Chief Complaint  ?Patient presents with  ? Hypertension  ?  [facility]; pt is not fasting  ? ? ?Subjective: ?[name] is a 56 y.o. male present for [facility] ?All past medical history, surgical history, allergies, family history, immunizations, medications and social history were updated in the electronic medical record today. ?All recent labs, ED visits and hospitalizations within the last year were reviewed. ? ?Hypertension/overweight/former smoker/fhx heart disease in mother:  ?Pt reports compliance with dilt-xr.  ?HHis Bp has been difficult to manage d/t noncompliance from reported side effects.  ?He was a prior smoker.  ?Patient denies chest pain, shortness of breath, dizziness or lower extremity edema.   ?Tried: amlodipine (stomach upset). Lisinopril (Headache). Losartan ("wet" cough).  ?Patient denies chest pain, shortness of breath, dizziness or lower extremity edema.  ?Diet: no routine ?Exercise: no routine ?RF: HTN, former smoker, Fhx ?CT CARDIAC SCORING (SELF PAY ONLY) ?Addendum Date: 07/31/2021   ?IMPRESSION: 1. No significant incidental noncardiac findings are noted.  ?IMPRESSION: Coronary calcium score of 0.  ? ? ?  01/14/2022  ?  8:25 AM 01/10/2021  ?  8:54 AM 05/02/2019  ? 11:01 AM 04/19/2018  ?  9:00 AM 02/10/2017  ?  8:14 AM  ?Depression screen PHQ 2/9  ?Decreased  Interest 0 0 0 0 0  ?Down, Depressed, Hopeless 0 0 0 0 0  ?PHQ - 2 Score 0 0 0 0 0  ?Altered sleeping 0      ?Tired, decreased energy 0      ?Change in appetite 0      ?Feeling bad or failure about yourself  0      ?Trouble concentrating 0      ?Moving slowly or fidgety/restless 0      ?Suicidal thoughts 0      ?PHQ-9 Score 0      ? ? ?  01/14/2022  ?  8:25 AM  ?GAD 7 : Generalized Anxiety Score  ?Nervous, Anxious, on Edge 0  ?Control/stop worrying 0  ?Worry too much - different things 0  ?Trouble relaxing 0  ?Restless 0  ?Easily annoyed or irritable 0  ?Afraid - awful might happen 0  ?Total GAD 7 Score 0  ? ? ?  ?  ? ?  04/19/2018  ?  9:00 AM 02/10/2017  ?  8:14 AM  ?Fall Risk   ?Falls in the past year? No No  ? ? ?Immunization History  ?Administered Date(s) Administered  ? Influenza-Unspecified 07/24/2018  ? Tdap 11/23/2010, 01/10/2021  ? Zoster Recombinat (Shingrix) 05/02/2019, 08/07/2019  ? ?Past Medical History:  ?Diagnosis Date  ? Allergy   ? mild- uses honey   ? Chicken pox   ? Colon cancer ([place])   ? colon  ? COVID-19 01/10/2021  ? 11/2020  ? Family history of colon cancer   ? Family history  of prostate cancer   ? GERD (gastroesophageal reflux disease)   ? diet related   ? Hepatitis B   ? Hypertension   ? kidney stone   ? kidney stone   ? Lymphadenitis   ? Pneumothorax   ? ?Allergies  ?Allergen Reactions  ? Amlodipine Nausea And Vomiting  ?  Stomach upset ?  ? Lisinopril Other (See Comments)  ?  headache  ? ?Past Surgical History:  ?Procedure Laterality Date  ? COLON SURGERY  2008  ? COLONOSCOPY    ? PARTIAL COLECTOMY    ? colon caner  ? POLYPECTOMY    ? ?Family History  ?Problem Relation Age of Onset  ? Arthritis Mother   ? COPD Mother   ? Heart disease Mother   ? Colon cancer Mother 4  ? Colon cancer Maternal Uncle 61  ?     stage IV, d. 81  ? Prostate cancer Maternal Uncle   ?     dx 97s, d. 51  ? Colon polyps Daughter   ? Colon cancer Cousin   ?     Stage IV, dx. 72s, d. 40  ? Esophageal cancer Neg Hx   ?  Pancreatic cancer Neg Hx   ? Rectal cancer Neg Hx   ? Stomach cancer Neg Hx   ? ?Social History  ? ?Social History Narrative  ? Single, 1 child.  ? Self-employed, college education.  ? Drinks caffeinated beverages, uses herbal remedies.  ? Wears his seatbelt, exercises greater than 3 times a week.  ? Smoke detector in the home, firearms in the home in a locked cabinet.  ? Feels safe in his relationships.  ? ? ?Allergies as of 02/23/2022   ? ?   Reactions  ? Amlodipine Nausea And Vomiting  ? Stomach upset  ? Lisinopril Other (See Comments)  ? headache  ? ?  ? ?  ?Medication List  ?  ? ?  ? Accurate as of February 23, 2022  8:51 AM. If you have any questions, ask your nurse or doctor.  ?  ?  ? ?  ? ?STOP taking these medications   ? ?Dilt-XR 180 MG 24 hr capsule ?Generic drug: diltiazem ?Stopped by: Howard Pouch, DO ?  ? ?  ? ?TAKE these medications   ? ?valsartan 320 MG tablet ?Commonly known as: DIOVAN ?Take 0.5 tablets (160 mg total) by mouth daily. ?Started by: Howard Pouch, DO ?  ?VITAMIN D-3 PO ?Take by mouth. ?  ? ?  ? ?All past medical history, surgical history, allergies, family history, immunizations andmedications were updated in the EMR today and reviewed under the history and medication portions of their EMR.    ? ? ? ? ? ?ROS ?14 pt review of systems performed and negative (unless mentioned in an HPI) ?Objective: ?BP (!) 173/80   Pulse (!) 51   Temp 97.8 ?F (36.6 ?C) (Oral)   Ht 5' 7.25" (1.708 m)   Wt 175 lb (79.4 kg)   SpO2 100%   BMI 27.21 kg/m?  ?Physical Exam ?Vitals and nursing note reviewed.  ?Constitutional:   ?   General: He is not in acute distress. ?   Appearance: Normal appearance. He is not ill-appearing, toxic-appearing or diaphoretic.  ?HENT:  ?   Head: Normocephalic and atraumatic.  ?   Mouth/Throat:  ?   Mouth: Mucous membranes are moist.  ?   Pharynx: Oropharynx is clear. No oropharyngeal exudate or posterior oropharyngeal erythema.  ?Eyes:  ?  Extraocular Movements: Extraocular  movements intact.  ?   Pupils: Pupils are equal, round, and reactive to light.  ?Cardiovascular:  ?   Rate and Rhythm: Normal rate and regular rhythm.  ?   Pulses: Normal pulses.  ?   Heart sounds: No murmur heard. ?Musculoskeletal:  ?   Right lower leg: No edema.  ?   Left lower leg: No edema.  ?Skin: ?   General: Skin is warm and dry.  ?   Coloration: Skin is not jaundiced or pale.  ?   Findings: No bruising, lesion or rash.  ?Neurological:  ?   General: No focal deficit present.  ?   Mental Status: He is alert and oriented to person, place, and time. Mental status is at baseline.  ?   Cranial Nerves: No cranial nerve deficit.  ?   Sensory: No sensory deficit.  ?   Motor: No weakness.  ?   Coordination: Coordination normal.  ?   Gait: Gait normal.  ?   Deep Tendon Reflexes: Reflexes normal.  ?Psychiatric:     ?   Mood and Affect: Mood normal.     ?   Behavior: Behavior normal.     ?   Thought Content: Thought content normal.     ?   Judgment: Judgment normal.  ? ? ?No results found. ? ?Assessment/plan: ?[name] is a 56 y.o. male present for [facility] ?Essential hypertension/Overweight (BMI 25.0-29.9)/Family history of heart disease/former smoker/CAD ?Stable.  ?Stop dilt-xr ?Start diovan 360 ?-We discussed he should have his blood pressure medications in his system at least 2 hours before his appointment in the future. ?-We also discussed pathophysiology of hypertension, genetics etc. ?-He is aware blood pressure goal is <130/80. ?- heart healthy diet encouraged.  ?- Cardiac CT - ZERO (07/2021) ?- Consider AAA screen at 1.  ?-LDL has been at goal less than 100.   ?-Follow-up 3 months, if still not managed at this appt with refer to cariology for assistance/  ? ? ?Return in about 24 weeks (around 08/10/2022) for Routine chronic condition follow-up. ? ?No orders of the defined types were placed in this encounter. ? ?Meds ordered this encounter  ?Medications  ? valsartan (DIOVAN) 320 MG tablet  ?  Sig: Take 0.5  tablets (160 mg total) by mouth daily.  ?  Dispense:  90 tablet  ?  Refill:  1  ?  Dc dilt-xr  ? ?Referral Orders  ?No referral(s) requested today  ? ?Note is dictated utilizing voice recognition software. Although note ha

## 2022-03-11 ENCOUNTER — Encounter: Payer: Self-pay | Admitting: Family Medicine

## 2022-03-11 DIAGNOSIS — I10 Essential (primary) hypertension: Secondary | ICD-10-CM

## 2022-03-11 DIAGNOSIS — R0600 Dyspnea, unspecified: Secondary | ICD-10-CM

## 2022-03-11 NOTE — Telephone Encounter (Signed)
Spoke with pt regarding sxs; he denied having nausea/vomiting, blurred vision, dizziness, headaches. Currently having shortness of breath off and on for the past week and minor chest pain. He was sick 1 week ago and not sure if that contributed any. He was last seen by provider 4/3 and started on Valsartan.  Yesterday BP 195/110, today averaging 165/107. BP consistently elevated. He has not taken any BP meds today or in the last few days. He believes new medication is causing shortness of breath. Pt transferred to triage nurse. ? ?Sent as FYI ?

## 2022-03-12 ENCOUNTER — Telehealth: Payer: Self-pay

## 2022-03-12 DIAGNOSIS — R0600 Dyspnea, unspecified: Secondary | ICD-10-CM | POA: Insufficient documentation

## 2022-03-12 NOTE — Telephone Encounter (Signed)
Placed referral for him ?

## 2022-03-12 NOTE — Telephone Encounter (Signed)
[name] - Client ?TELEPHONE ADVICE RECORD ?[facility]? ?Patient Name: ?[name] ?Gender: Male ?DOB: [birth year] ?Age: 56 Y 53 M 1 D ?Return ?Phone ?Number: ?[phone] ?(Primary) ?Address: ?City/ ?State/ ?Zip: [name]  ? [address] ?Client [name] - Client ?Client Site [facility] ?Provider Howard Pouch ?Contact Type Call ?Who Is Calling Patient / Member / Family / Caregiver ?Call Type Triage / Clinical ?Relationship To Patient Self ?Return Phone Number [phone] (Primary) ?Chief Complaint BREATHING - shortness of breath or sounds breathless ?Reason for Call Symptomatic / Request for Health Information ?Initial Comment Caller states he needs a referral for a cardiologist. ?He is having shortness of breath. ?Translation No ?Nurse Assessment ?Nurse: Zorita Pang, RN, Deborah Date/Time (Eastern Time): 03/11/2022 3:10:45 PM ?Confirm and document reason for call. If ?symptomatic, describe symptoms. ?---The caller thinks that he may have had COVID ?last week and he is having difficulty breathing on and off since then. He did not get a positive reading but may have not have done the test correctly. States that he is more concerned about his BP. He took a CoQ10 yesterday and he felt a rush. Then he had a burst of energy and got hot. Had not chest pain. Was on Losartan and it was causing him to be constipation and other things. His BP has been up 160/100 and this morning 157/107. Then his doctor put him on Valsartan and that caused SOB. ?Does the patient have any new or worsening ?symptoms? ---Yes ?Will a triage be completed? ---Yes ?Related visit to physician within the last 2 weeks? ---Yes ?Does the PT have any chronic conditions? (i.e. ?diabetes, asthma, this includes High risk factors for pregnancy, etc.) ?---Yes ?List chronic conditions. ---S/P colon problems, hypertension ?Is this a behavioral health or substance abuse call?  ---No ?Guidelines ?Guideline Title Affirmed Question Affirmed Notes Nurse Date/Time (Eastern ?Time) ?Blood Pressure - ?High ?Systolic BP >= 702 ?OR Diastolic >= 637 ?Womble, RN, ?Neoma Laming ?03/11/2022 3:25:49 ?PM ?PLEASE NOTE: All timestamps contained within this report are represented as [place] Standard Time. ?CONFIDENTIALTY NOTICE: This fax transmission is intended only for the addressee. It contains information that is legally privileged, confidential or ?otherwise protected from use or disclosure. If you are not the intended recipient, you are strictly prohibited from reviewing, disclosing, copying using ?or disseminating any of this information or taking any action in reliance on or regarding this information. If you have received this fax in error, please ?notify us immediately by telephone so that we can arrange for its return to [place]. Phone: [phone], Toll-Free: [phone], Fax: [phone] ?Page: 2 of 2 ?Call Id: [id number] ?Disp. Time (Eastern ?Time) Disposition Final User ?03/11/2022 3:09:26 PM Send to Urgent Marina Goodell, [place] ?03/11/2022 3:30:36 PM SEE PCP WITHIN 3 DAYS Yes Womble, RN, Neoma Laming ?Caller Disagree/Comply Comply ?Caller Understands Yes ?PreDisposition Call Doctor ?Care Advice Given Per Guideline ?SEE PCP WITHIN 3 DAYS: * Untreated high blood pressure may cause damage to your heart, brain, kidneys, and eyes. * EAT ?HEALTHY: Eat a diet rich in fresh fruits and vegetables, dietary fiber, non-animal protein (e.g., soy), and low-fat dairy products. ?Avoid foods with a high content of saturated fat or cholesterol. * Weakness or numbness of the face, arm or leg on one side of the ?body occurs * Chest pain or difficulty breathing occurs * Your blood pressure is over 180/110 ?

## 2022-03-12 NOTE — Telephone Encounter (Signed)
Spoke with patient, scheduled appt with Dr. Raoul Pitch tomorrow 4/21 at 10:20AM ?

## 2022-03-12 NOTE — Telephone Encounter (Signed)
[facility] - Client ?Nonclinical Telephone Record  ?[facility]? ?Client [facility] - Client ?Client [facility] ?Provider Howard Pouch ?Contact Type Call ?Who Is Calling Patient / Member / Family / Caregiver ?Caller Name Loel Betancur ?Caller Phone Number [phone] ?Patient Name [name] ?Patient DOB June 08, 1966 ?Call Type Message Only Information Provided ?Reason for Call Request to Schedule Office Appointment ?Initial Comment Caller states he wants to make an appt. ?Disp. Time Disposition Final User ?03/12/2022 7:30:37 AM General Information Provided Yes Lisette Abu ?Call Closed By: Lisette Abu ?Transaction Date/Time: 03/12/2022 7:27:07 AM (ET) ? ? ?Please assist patient with scheduling, thanks. ? ?

## 2022-03-13 ENCOUNTER — Telehealth: Payer: Self-pay | Admitting: Family Medicine

## 2022-03-13 ENCOUNTER — Ambulatory Visit (INDEPENDENT_AMBULATORY_CARE_PROVIDER_SITE_OTHER): Payer: 59 | Admitting: Family Medicine

## 2022-03-13 ENCOUNTER — Encounter: Payer: Self-pay | Admitting: Family Medicine

## 2022-03-13 ENCOUNTER — Ambulatory Visit (HOSPITAL_BASED_OUTPATIENT_CLINIC_OR_DEPARTMENT_OTHER)
Admission: RE | Admit: 2022-03-13 | Discharge: 2022-03-13 | Disposition: A | Discharge status: Home / Self Care | Payer: 59 | Source: Ambulatory Visit | Attending: Family Medicine | Admitting: Family Medicine

## 2022-03-13 VITALS — BP 157/87 | HR 63 | Temp 98.0°F | Ht 67.25 in | Wt 169.0 lb

## 2022-03-13 DIAGNOSIS — Z8249 Family history of ischemic heart disease and other diseases of the circulatory system: Secondary | ICD-10-CM | POA: Diagnosis not present

## 2022-03-13 DIAGNOSIS — I10 Essential (primary) hypertension: Secondary | ICD-10-CM | POA: Diagnosis not present

## 2022-03-13 DIAGNOSIS — R0602 Shortness of breath: Secondary | ICD-10-CM

## 2022-03-13 MED ORDER — VERAPAMIL HCL ER 120 MG PO TBCR: 120.0000 mg | EXTENDED_RELEASE_TABLET | Freq: Every day | ORAL | 1 refills | Status: DC

## 2022-03-13 MED ORDER — AZITHROMYCIN 250 MG PO TABS: ORAL_TABLET | ORAL | 0 refills | Status: AC

## 2022-03-13 NOTE — Telephone Encounter (Signed)
Please call patient: ?I am waiting on the formal read from the radiology department on his chest x-ray.  There does seem to be a patchy area that could be infectious and I would like to go ahead and start a Z-Pak on him while we wait on the formal read. ?

## 2022-03-13 NOTE — Telephone Encounter (Signed)
Spoke with pt regarding labs and instructions.   

## 2022-03-13 NOTE — Progress Notes (Signed)
? ?This visit occurred during the SARS-CoV-2 public health emergency.  Safety protocols were in place, including screening questions prior to the visit, additional usage of staff PPE, and extensive cleaning of exam room while observing appropriate contact time as indicated for disinfecting solutions.  ? ? ?Patient ID: [name], male  DOB: December 28, 1965, 56 y.o.   MRN: [id number] ?Patient Care Team  ?  Relationship Specialty Notifications Start End  ?Ma Hillock, DO PCP - General Family Medicine  02/10/16   ?Doran Stabler, MD Consulting Physician Gastroenterology  04/19/18   ?Ladell Pier, MD Consulting Physician Oncology  05/03/19   ? ? ?Chief Complaint  ?Patient presents with  ? Hypertension  ?  F/u   ? ? ?Subjective: ?[name] is a 56 y.o. male present for [facility] ?All past medical history, surgical history, allergies, family history, immunizations, medications and social history were updated in the electronic medical record today. ?All recent labs, ED visits and hospitalizations within the last year were reviewed. ? ?Hypertension/overweight/former smoker/fhx heart disease in mother:  ?Pt reports SE to diovan so he stopped use.  He reports feeling short of breath.  He does admit the shortness of breath symptoms onset was after he visited his girlfriend in the hospital and she was ill.  He then developed illness with a negative COVID test.  He feels his breathing is improved, but admits that he felt the Diovan was worsening the breathing discomfort. ?His Bp has been difficult to manage d/t noncompliance from reported side effects.  ?He was a prior smoker. Patient denies current chest pain, shortness of breath, dizziness or lower extremity edema.  ?Tried: amlodipine (stomach upset). Lisinopril (Headache). Losartan ("wet" cough), diovan (SOB and cough). Dilt- fatigue ?Patient denies chest pain, shortness of breath, dizziness or lower extremity edema.  ?Diet: no routine ?Exercise: no routine ?RF: HTN,  former smoker, Fhx ?CT CARDIAC SCORING (SELF PAY ONLY) ?Addendum Date: 07/31/2021   ?IMPRESSION: 1. No significant incidental noncardiac findings are noted.  ?IMPRESSION: Coronary calcium score of 0.  ? ? ?  01/14/2022  ?  8:25 AM 01/10/2021  ?  8:54 AM 05/02/2019  ? 11:01 AM 04/19/2018  ?  9:00 AM 02/10/2017  ?  8:14 AM  ?Depression screen PHQ 2/9  ?Decreased Interest 0 0 0 0 0  ?Down, Depressed, Hopeless 0 0 0 0 0  ?PHQ - 2 Score 0 0 0 0 0  ?Altered sleeping 0      ?Tired, decreased energy 0      ?Change in appetite 0      ?Feeling bad or failure about yourself  0      ?Trouble concentrating 0      ?Moving slowly or fidgety/restless 0      ?Suicidal thoughts 0      ?PHQ-9 Score 0      ? ? ?  01/14/2022  ?  8:25 AM  ?GAD 7 : Generalized Anxiety Score  ?Nervous, Anxious, on Edge 0  ?Control/stop worrying 0  ?Worry too much - different things 0  ?Trouble relaxing 0  ?Restless 0  ?Easily annoyed or irritable 0  ?Afraid - awful might happen 0  ?Total GAD 7 Score 0  ? ?  ?  ? ?  04/19/2018  ?  9:00 AM 02/10/2017  ?  8:14 AM  ?Fall Risk   ?Falls in the past year? No No  ? ? ?Immunization History  ?Administered Date(s) Administered  ? Influenza-Unspecified 07/24/2018  ?  Tdap 11/23/2010, 01/10/2021  ? Zoster Recombinat (Shingrix) 05/02/2019, 08/07/2019  ? ?Past Medical History:  ?Diagnosis Date  ? Allergy   ? mild- uses honey   ? Chicken pox   ? Colon cancer ([place])   ? colon  ? COVID-19 01/10/2021  ? 11/2020  ? Family history of colon cancer   ? Family history of prostate cancer   ? GERD (gastroesophageal reflux disease)   ? diet related   ? Hepatitis B   ? Hypertension   ? kidney stone   ? kidney stone   ? Lymphadenitis   ? Pneumothorax   ? ?Allergies  ?Allergen Reactions  ? Amlodipine Nausea And Vomiting  ?  Stomach upset ?  ? Losartan   ?  "Wet cough"  ? Valsartan   ?  Shortness of breath and cough  ? Lisinopril Other (See Comments)  ?  headache  ? ?Past Surgical History:  ?Procedure Laterality Date  ? COLON SURGERY  2008  ?  COLONOSCOPY    ? PARTIAL COLECTOMY    ? colon caner  ? POLYPECTOMY    ? ?Family History  ?Problem Relation Age of Onset  ? Arthritis Mother   ? COPD Mother   ? Heart disease Mother   ? Colon cancer Mother 5  ? Colon cancer Maternal Uncle 74  ?     stage IV, d. 92  ? Prostate cancer Maternal Uncle   ?     dx 71s, d. 10  ? Colon polyps Daughter   ? Colon cancer Cousin   ?     Stage IV, dx. 27s, d. 18  ? Esophageal cancer Neg Hx   ? Pancreatic cancer Neg Hx   ? Rectal cancer Neg Hx   ? Stomach cancer Neg Hx   ? ?Social History  ? ?Social History Narrative  ? Single, 1 child.  ? Self-employed, college education.  ? Drinks caffeinated beverages, uses herbal remedies.  ? Wears his seatbelt, exercises greater than 3 times a week.  ? Smoke detector in the home, firearms in the home in a locked cabinet.  ? Feels safe in his relationships.  ? ? ?Allergies as of 03/13/2022   ? ?   Reactions  ? Amlodipine Nausea And Vomiting  ? Stomach upset  ? Losartan   ? "Wet cough"  ? Valsartan   ? Shortness of breath and cough  ? Lisinopril Other (See Comments)  ? headache  ? ?  ? ?  ?Medication List  ?  ? ?  ? Accurate as of March 13, 2022 12:36 PM. If you have any questions, ask your nurse or doctor.  ?  ?  ? ?  ? ?STOP taking these medications   ? ?valsartan 320 MG tablet ?Commonly known as: DIOVAN ?Stopped by: Howard Pouch, DO ?  ? ?  ? ?TAKE these medications   ? ?co-enzyme Q-10 30 MG capsule ?Take 30 mg by mouth 3 (three) times daily. ?  ?verapamil 120 MG CR tablet ?Commonly known as: CALAN-SR ?Take 1 tablet (120 mg total) by mouth at bedtime. ?Started by: Howard Pouch, DO ?  ?VITAMIN D-3 PO ?Take by mouth. ?  ? ?  ? ?All past medical history, surgical history, allergies, family history, immunizations andmedications were updated in the EMR today and reviewed under the history and medication portions of their EMR.    ? ?ROS ?14 pt review of systems performed and negative (unless mentioned in an HPI) ?Objective: ?BP (!) 157/87  Pulse  63   Temp 98 ?F (36.7 ?C) (Oral)   Ht 5' 7.25" (1.708 m)   Wt 169 lb (76.7 kg)   SpO2 98%   BMI 26.27 kg/m?  ?Physical Exam ?Vitals and nursing note reviewed.  ?Constitutional:   ?   General: He is not in acute distress. ?   Appearance: Normal appearance. He is not ill-appearing, toxic-appearing or diaphoretic.  ?HENT:  ?   Head: Normocephalic and atraumatic.  ?   Right Ear: Tympanic membrane and ear canal normal.  ?   Left Ear: Tympanic membrane and ear canal normal.  ?   Nose: Nose normal. No congestion or rhinorrhea.  ?   Mouth/Throat:  ?   Pharynx: No oropharyngeal exudate or posterior oropharyngeal erythema.  ?Eyes:  ?   Extraocular Movements: Extraocular movements intact.  ?   Pupils: Pupils are equal, round, and reactive to light.  ?Cardiovascular:  ?   Rate and Rhythm: Normal rate and regular rhythm.  ?   Pulses: Normal pulses.  ?   Heart sounds: No murmur heard. ?Pulmonary:  ?   Effort: Pulmonary effort is normal. No respiratory distress.  ?   Breath sounds: Normal breath sounds. No wheezing, rhonchi or rales.  ?Musculoskeletal:  ?   Right lower leg: No edema.  ?   Left lower leg: No edema.  ?Skin: ?   General: Skin is warm and dry.  ?   Findings: No rash.  ?Neurological:  ?   General: No focal deficit present.  ?   Mental Status: He is alert and oriented to person, place, and time. Mental status is at baseline.  ?   Cranial Nerves: No cranial nerve deficit.  ?   Sensory: No sensory deficit.  ?   Motor: No weakness.  ?   Coordination: Coordination normal.  ?   Gait: Gait normal.  ?   Deep Tendon Reflexes: Reflexes normal.  ?Psychiatric:     ?   Mood and Affect: Mood normal.     ?   Behavior: Behavior normal.     ?   Thought Content: Thought content normal.     ?   Judgment: Judgment normal.  ? ? ?No results found. ? ?Assessment/plan: ?[name] is a 56 y.o. male present for [facility] ?Essential hypertension/Overweight (BMI 25.0-29.9)/Family history of heart disease/former smoker ?Blood pressure is  elevated as expected with him not taking his medication.  Uncertain if Diovan caused his sensation of shortness of breath.  He was sick during that time,after exposure to illness from his girlfriend who was hospitalize

## 2022-03-14 NOTE — Progress Notes (Signed)
?  ?Cardiology Office Note ? ? ?Date:  03/16/2022  ? ?ID:  [name], DOB April 30, 1966, MRN [id number] ? ?PCP:  Howard Pouch A, DO  ?Cardiologist:   None ?Referring:  Howard Pouch A, DO ? ? ?Chief Complaint  ?Patient presents with  ? Hypertension  ? ? ?  ?History of Present Illness: ?[name] is a 56 y.o. male who presents for evaluation of hypertension.  This has been difficult to control and so he is referred here.  He has no prior cardiac history.  He had a calcium score in Sept 2022 that was zero.  I do note that he has been on multiple medications.  He had nausea and vomiting with amlodipine.  He had a wet cough with losartan.  He had shortness of breath with valsartan.  He had headaches with lisinopril.  He just was started on verapamil and took his first dose yesterday. ? ?He is otherwise well and physically active.  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  The patient denies any new symptoms such as chest discomfort, neck or arm discomfort. There has been no new shortness of breath, PND or orthopnea. There have been no reported palpitations, presyncope or syncope.  ? ?Past Medical History:  ?Diagnosis Date  ? Allergy   ? mild- uses honey   ? Chicken pox   ? Colon cancer ([place])   ? colon  ? COVID-19 01/10/2021  ? 11/2020  ? Family history of colon cancer   ? Family history of prostate cancer   ? GERD (gastroesophageal reflux disease)   ? diet related   ? Hepatitis B   ? Hypertension   ? kidney stone   ? kidney stone   ? Lymphadenitis   ? Pneumothorax   ? ? ?Past Surgical History:  ?Procedure Laterality Date  ? COLON SURGERY  2008  ? COLONOSCOPY    ? PARTIAL COLECTOMY    ? colon caner  ? POLYPECTOMY    ? ? ? ?Current Outpatient Medications  ?Medication Sig Dispense Refill  ? Cholecalciferol (VITAMIN D-3 PO) Take by mouth.    ? co-enzyme Q-10 30 MG capsule Take 30 mg by mouth 3 (three) times daily.    ? verapamil (CALAN-SR) 120 MG CR tablet Take 1 tablet (120 mg total) by mouth at bedtime. 90 tablet 1   ? azithromycin (ZITHROMAX) 250 MG tablet Take 2 tablets on day 1, then 1 tablet daily on days 2 through 5 (Patient not taking: Reported on 03/16/2022) 6 tablet 0  ? ?No current facility-administered medications for this visit.  ? ? ?Allergies:   Amlodipine, Losartan, Valsartan, and Lisinopril  ? ? ?Social History:  The patient  reports that he has quit smoking. He has never used smokeless tobacco. He reports that he does not drink alcohol and does not use drugs.  ? ?Family History:  The patient's family history includes Arthritis in his mother; COPD in his mother; Colon cancer in his cousin; Colon cancer (age of onset: 49) in his mother; Colon cancer (age of onset: 38) in his maternal uncle; Colon polyps in his daughter; Heart disease in his mother; Prostate cancer in his maternal uncle.  ? ? ?ROS:  Please see the history of present illness.   Otherwise, review of systems are positive for none.   All other systems are reviewed and negative.  ? ? ?PHYSICAL EXAM: ?VS:  BP (!) 155/100   Pulse (!) 53   Ht '5\' 7"'$  (1.702 m)  Wt 172 lb 3.2 oz (78.1 kg)   SpO2 99%   BMI 26.97 kg/m?  , BMI Body mass index is 26.97 kg/m?. ?GENERAL:  Well appearing ?HEENT:  Pupils equal round and reactive, fundi not visualized, oral mucosa unremarkable ?NECK:  No jugular venous distention, waveform within normal limits, carotid upstroke brisk and symmetric, no bruits, no thyromegaly ?LYMPHATICS:  No cervical, inguinal adenopathy ?LUNGS:  Clear to auscultation bilaterally ?BACK:  No CVA tenderness ?CHEST:  Unremarkable ?HEART:  PMI not displaced or sustained,S1 and S2 within normal limits, no S3, no S4, no clicks, no rubs, no murmurs ?ABD:  Flat, positive bowel sounds normal in frequency in pitch, no bruits, no rebound, no guarding, no midline pulsatile mass, no hepatomegaly, no splenomegaly ?EXT:  2 plus pulses throughout, no edema, no cyanosis no clubbing ?SKIN:  No rashes no nodules ?NEURO:  Cranial nerves II through XII grossly intact,  motor grossly intact throughout ?PSYCH:  Cognitively intact, oriented to person place and time ? ? ? ?EKG:  EKG is ordered today. ?The ekg ordered today demonstrates sinus bradycardia, rate 53, axis within normal limits, intervals within normal limits, no acute ST-T wave changes. ? ? ?Recent Labs: ?01/14/2022: ALT 28; BUN 18; Creatinine, Ser 1.10; Hemoglobin 14.3; Platelets 201.0; Potassium 3.9; Sodium 140; TSH 1.10  ? ? ?Lipid Panel ?   ?Component Value Date/Time  ? CHOL 160 01/14/2022 0839  ? TRIG 60.0 01/14/2022 0839  ? TRIG 77 12/03/2006 1156  ? HDL 45.30 01/14/2022 0839  ? CHOLHDL 4 01/14/2022 0839  ? VLDL 12.0 01/14/2022 0839  ? [place] 103 (H) 01/14/2022 3419  ? LDLDIRECT 101.0 02/10/2017 0912  ? ?  ? ?Wt Readings from Last 3 Encounters:  ?03/16/22 172 lb 3.2 oz (78.1 kg)  ?03/13/22 169 lb (76.7 kg)  ?02/23/22 175 lb (79.4 kg)  ?  ? ? ?Other studies Reviewed: ?Additional studies/ records that were reviewed today include: Office records. ?Review of the above records demonstrates:  Please see elsewhere in the note.   ? ? ?ASSESSMENT AND PLAN: ? ?Difficult to control HTN: The patient unfortunately has been intolerant of multiple medications so far.  He was just started on verapamil which is reasonable.  We had a conversation about this.  He is not overweight.  He could restrict his salt.  He is physically active.  There are not any other therapeutic lifestyle changes that I might suggest.  He will keep a blood pressure diary on the verapamil and send this to me via My Chart.  If this is not controlled on verapamil we will consider higher dose versus switching to a different agent.  At this point I will not start looking for secondary causes.  I will wait and see how easily his blood pressure is controlled. ? ?Family history of early CAD: His mother had heart disease but is not quite sure of the details.  He does not know his father's history.  He has a 0 calcium score.  He otherwise has controlled risk factors.   No further evaluation. ? ? ?Current medicines are reviewed at length with the patient today.  The patient does not have concerns regarding medicines. ? ?The following changes have been made:  no change ? ?Labs/ tests ordered today include: None ? ?Orders Placed This Encounter  ?Procedures  ? EKG 12-Lead  ? ? ? ?Disposition:   FU with me in six months.   ? ? ?Signed, ?Minus Breeding, MD  ?03/16/2022 8:48 AM    ?  [place] ? ? ? ?

## 2022-03-16 ENCOUNTER — Encounter: Payer: Self-pay | Admitting: Family Medicine

## 2022-03-16 ENCOUNTER — Telehealth: Payer: Self-pay | Admitting: Family Medicine

## 2022-03-16 ENCOUNTER — Encounter: Payer: Self-pay | Admitting: Cardiology

## 2022-03-16 ENCOUNTER — Ambulatory Visit: Payer: 59 | Admitting: Cardiology

## 2022-03-16 VITALS — BP 155/100 | HR 53 | Ht 67.0 in | Wt 172.2 lb

## 2022-03-16 DIAGNOSIS — I10 Essential (primary) hypertension: Secondary | ICD-10-CM

## 2022-03-16 NOTE — Patient Instructions (Signed)
Medication Instructions:  ?Your Physician recommend you continue on your current medication as directed.   ? ?*If you need a refill on your cardiac medications before your next appointment, please call your pharmacy* ? ? ?Lab Work: ?None ordered today ? ? ?Testing/Procedures: ?None ordered today ? ? ?Follow-Up: ?At [facility], you and your health needs are our priority.  As part of our continuing mission to provide you with exceptional heart care, we have created designated Provider Care Teams.  These Care Teams include your primary Cardiologist (physician) and Advanced Practice Providers (APPs -  Physician Assistants and Nurse Practitioners) who all work together to provide you with the care you need, when you need it. ? ?We recommend signing up for the patient portal called "MyChart".  Sign up information is provided on this After Visit Summary.  MyChart is used to connect with patients for Virtual Visits (Telemedicine).  Patients are able to view lab/test results, encounter notes, upcoming appointments, etc.  Non-urgent messages can be sent to your provider as well.   ?To learn more about what you can do with MyChart, go to [email or web address].   ? ?Your next appointment:   ?6 month(s) ? ?The format for your next appointment:   ?In Person ? ?Provider:   ?Dr. Percival Spanish ? ?Your physician recommends checking your blood pressure three times a day. ? ?Important Information About Sugar ? ? ? ? ? ? ?

## 2022-03-16 NOTE — Telephone Encounter (Signed)
LVM for pt to CB regarding results.  

## 2022-03-16 NOTE — Telephone Encounter (Signed)
Spoke with pt regarding labs and instructions.   

## 2022-03-16 NOTE — Telephone Encounter (Signed)
Please inform patient his checks x-ray results are normal. ?

## 2022-03-18 ENCOUNTER — Telehealth: Payer: Self-pay | Admitting: *Deleted

## 2022-03-18 MED ORDER — CLONIDINE HCL 0.1 MG PO TABS: 0.1000 mg | ORAL_TABLET | Freq: Two times a day (BID) | ORAL | 3 refills | Status: DC

## 2022-03-18 NOTE — Telephone Encounter (Signed)
Pt came in today as requested by Dr Percival Spanish for BP check and to verify pt understands how to use his home BP monitor. ?BP was 175/107  ?And 190/102. ? ?Per Dr Hochrein's order pt is to start Clonidine 0.1 mg BID in addition to his current medication. ? ?Pt is aware of order and RX send into [facility].  He will continue to monitor his BP at home and contact the office if no improvement.   ? ?

## 2022-03-23 DIAGNOSIS — R911 Solitary pulmonary nodule: Secondary | ICD-10-CM | POA: Diagnosis not present

## 2022-03-23 DIAGNOSIS — I10 Essential (primary) hypertension: Secondary | ICD-10-CM | POA: Diagnosis not present

## 2022-03-23 DIAGNOSIS — Z888 Allergy status to other drugs, medicaments and biological substances status: Secondary | ICD-10-CM | POA: Diagnosis not present

## 2022-03-23 DIAGNOSIS — Z91148 Patient's other noncompliance with medication regimen for other reason: Secondary | ICD-10-CM | POA: Diagnosis not present

## 2022-03-23 DIAGNOSIS — K2289 Other specified disease of esophagus: Secondary | ICD-10-CM | POA: Diagnosis not present

## 2022-03-23 DIAGNOSIS — R001 Bradycardia, unspecified: Secondary | ICD-10-CM | POA: Diagnosis not present

## 2022-03-23 DIAGNOSIS — R0602 Shortness of breath: Secondary | ICD-10-CM | POA: Diagnosis not present

## 2022-03-23 DIAGNOSIS — R9431 Abnormal electrocardiogram [ECG] [EKG]: Secondary | ICD-10-CM | POA: Diagnosis not present

## 2022-03-23 DIAGNOSIS — Z87891 Personal history of nicotine dependence: Secondary | ICD-10-CM | POA: Diagnosis not present

## 2022-03-23 DIAGNOSIS — R0789 Other chest pain: Secondary | ICD-10-CM | POA: Diagnosis not present

## 2022-03-23 DIAGNOSIS — R079 Chest pain, unspecified: Secondary | ICD-10-CM | POA: Diagnosis not present

## 2022-03-23 DIAGNOSIS — R059 Cough, unspecified: Secondary | ICD-10-CM | POA: Diagnosis not present

## 2022-03-23 DIAGNOSIS — R200 Anesthesia of skin: Secondary | ICD-10-CM | POA: Diagnosis not present

## 2022-03-24 ENCOUNTER — Encounter: Payer: Self-pay | Admitting: Family Medicine

## 2022-03-24 DIAGNOSIS — K2289 Other specified disease of esophagus: Secondary | ICD-10-CM | POA: Insufficient documentation

## 2022-03-24 DIAGNOSIS — R911 Solitary pulmonary nodule: Secondary | ICD-10-CM | POA: Insufficient documentation

## 2022-03-25 ENCOUNTER — Encounter: Payer: Self-pay | Admitting: Family Medicine

## 2022-03-25 ENCOUNTER — Ambulatory Visit (INDEPENDENT_AMBULATORY_CARE_PROVIDER_SITE_OTHER): Payer: 59 | Admitting: Family Medicine

## 2022-03-25 VITALS — BP 161/87 | HR 58 | Temp 97.8°F | Ht 67.0 in | Wt 166.0 lb

## 2022-03-25 DIAGNOSIS — K2289 Other specified disease of esophagus: Secondary | ICD-10-CM

## 2022-03-25 DIAGNOSIS — R0789 Other chest pain: Secondary | ICD-10-CM | POA: Diagnosis not present

## 2022-03-25 DIAGNOSIS — Z800 Family history of malignant neoplasm of digestive organs: Secondary | ICD-10-CM

## 2022-03-25 DIAGNOSIS — R911 Solitary pulmonary nodule: Secondary | ICD-10-CM | POA: Diagnosis not present

## 2022-03-25 NOTE — Patient Instructions (Signed)
Please start your medicine every night as directed. ? ?I have referred you to the pulmonology team and gastro team.  ?

## 2022-03-25 NOTE — Progress Notes (Signed)
? ?This visit occurred during the SARS-CoV-2 public health emergency.  Safety protocols were in place, including screening questions prior to the visit, additional usage of staff PPE, and extensive cleaning of exam room while observing appropriate contact time as indicated for disinfecting solutions.  ? ? ?Patient ID: [name], male  DOB: 1966/03/21, 56 y.o.   MRN: [id number] ?Patient Care Team  ?  Relationship Specialty Notifications Start End  ?Ma Hillock, DO PCP - General Family Medicine  02/10/16   ?Doran Stabler, MD Consulting Physician Gastroenterology  04/19/18   ?Ladell Pier, MD Consulting Physician Oncology  05/03/19   ? ? ?Chief Complaint  ?Patient presents with  ? Hypertension  ?  F/u;   ? ? ?Subjective: ?[name] is a 56 y.o. male present for ED follow-up visit ?Chest pain/hypertension/overweight/former smoker/fhx heart disease in mother:  ?Patient presented to ED with chest pain on 03/23/2022.  CBC, CMP, troponin, lipase, magnesium were all normal.  CT resulted with incidental finding of esophageal thickening and a pulmonary spiculated lesion.  Patient admits he had not been taking his hypertensive medicine.  He reports he noticed some chest discomfort and shortness of breath so he took his blood pressure at home and it was reportedly 235/120.  He also then noticed left hand numbness. ? ? ?Prior hypertensive note: ?Pt reports SE to diovan so he stopped use.  He reports feeling short of breath.  He does admit the shortness of breath symptoms onset was after he visited his girlfriend in the hospital and she was ill.  He then developed illness with a negative COVID test.  He feels his breathing is improved, but admits that he felt the Diovan was worsening the breathing discomfort. ?His Bp has been difficult to manage d/t noncompliance from reported side effects.  ?He was a prior smoker. Patient denies current chest pain, shortness of breath, dizziness or lower extremity edema.   ?Tried: amlodipine (stomach upset). Lisinopril (Headache). Losartan ("wet" cough), diovan (SOB and cough). Dilt- fatigue ?Patient denies chest pain, shortness of breath, dizziness or lower extremity edema.  ?Diet: no routine ?Exercise: no routine ?RF: HTN, former smoker, Fhx ?CT CARDIAC SCORING (SELF PAY ONLY) ?Addendum Date: 07/31/2021   ?IMPRESSION: 1. No significant incidental noncardiac findings are noted.  ?IMPRESSION: Coronary calcium score of 0.  ? ? ?  01/14/2022  ?  8:25 AM 01/10/2021  ?  8:54 AM 05/02/2019  ? 11:01 AM 04/19/2018  ?  9:00 AM 02/10/2017  ?  8:14 AM  ?Depression screen PHQ 2/9  ?Decreased Interest 0 0 0 0 0  ?Down, Depressed, Hopeless 0 0 0 0 0  ?PHQ - 2 Score 0 0 0 0 0  ?Altered sleeping 0      ?Tired, decreased energy 0      ?Change in appetite 0      ?Feeling bad or failure about yourself  0      ?Trouble concentrating 0      ?Moving slowly or fidgety/restless 0      ?Suicidal thoughts 0      ?PHQ-9 Score 0      ? ? ?  01/14/2022  ?  8:25 AM  ?GAD 7 : Generalized Anxiety Score  ?Nervous, Anxious, on Edge 0  ?Control/stop worrying 0  ?Worry too much - different things 0  ?Trouble relaxing 0  ?Restless 0  ?Easily annoyed or irritable 0  ?Afraid - awful might happen 0  ?Total GAD 7 Score  0  ? ?  ?  ? ?  04/19/2018  ?  9:00 AM 02/10/2017  ?  8:14 AM  ?Fall Risk   ?Falls in the past year? No No  ? ? ?Immunization History  ?Administered Date(s) Administered  ? Influenza-Unspecified 07/24/2018  ? Tdap 11/23/2010, 01/10/2021  ? Zoster Recombinat (Shingrix) 05/02/2019, 08/07/2019  ? ?Past Medical History:  ?Diagnosis Date  ? Allergy   ? mild- uses honey   ? Chicken pox   ? Colon cancer ([place])   ? colon  ? COVID-19 01/10/2021  ? 11/2020  ? Family history of colon cancer   ? Family history of prostate cancer   ? GERD (gastroesophageal reflux disease)   ? diet related   ? Hepatitis B   ? Hypertension   ? kidney stone   ? kidney stone   ? Lymphadenitis   ? Pneumothorax   ? ?Allergies  ?Allergen Reactions  ?  Amlodipine Nausea And Vomiting  ?  Stomach upset ?  ? Losartan   ?  "Wet cough"  ? Valsartan   ?  Shortness of breath and cough  ? Lisinopril Other (See Comments)  ?  headache  ? ?Past Surgical History:  ?Procedure Laterality Date  ? COLON SURGERY  2008  ? COLONOSCOPY    ? PARTIAL COLECTOMY    ? colon caner  ? POLYPECTOMY    ? ?Family History  ?Problem Relation Age of Onset  ? Arthritis Mother   ? COPD Mother   ? Heart disease Mother   ?     Heart attack, no details  ? Colon cancer Mother 63  ? Colon polyps Daughter   ? Colon cancer Maternal Uncle 9  ?     stage IV, d. 102  ? Prostate cancer Maternal Uncle   ?     dx 83s, d. 36  ? Colon cancer Cousin   ?     Stage IV, dx. 51s, d. 52  ? Esophageal cancer Neg Hx   ? Pancreatic cancer Neg Hx   ? Rectal cancer Neg Hx   ? Stomach cancer Neg Hx   ? ?Social History  ? ?Social History Narrative  ? Single, 1 child.  ? Self-employed, college education.  ? Drinks caffeinated beverages, uses herbal remedies.  ? Wears his seatbelt, exercises greater than 3 times a week.  ? Smoke detector in the home, firearms in the home in a locked cabinet.  ? Feels safe in his relationships.  ? ? ?Allergies as of 03/25/2022   ? ?   Reactions  ? Amlodipine Nausea And Vomiting  ? Stomach upset  ? Losartan   ? "Wet cough"  ? Valsartan   ? Shortness of breath and cough  ? Lisinopril Other (See Comments)  ? headache  ? ?  ? ?  ?Medication List  ?  ? ?  ? Accurate as of Mar 25, 2022 11:59 PM. If you have any questions, ask your nurse or doctor.  ?  ?  ? ?  ? ?cloNIDine 0.1 MG tablet ?Commonly known as: Catapres ?Take 1 tablet (0.1 mg total) by mouth 2 (two) times daily. ?  ?co-enzyme Q-10 30 MG capsule ?Take 30 mg by mouth 3 (three) times daily. ?  ?verapamil 120 MG CR tablet ?Commonly known as: CALAN-SR ?Take 1 tablet (120 mg total) by mouth at bedtime. ?  ?VITAMIN D-3 PO ?Take by mouth. ?  ? ?  ? ?All past medical history, surgical history, allergies,  family history, immunizations andmedications were  updated in the EMR today and reviewed under the history and medication portions of their EMR.    ? ?ROS ?14 pt review of systems performed and negative (unless mentioned in an HPI) ?Objective: ?BP (!) 161/87   Pulse (!) 58   Temp 97.8 ?F (36.6 ?C) (Oral)   Ht '5\' 7"'$  (1.702 m)   Wt 166 lb (75.3 kg)   SpO2 98%   BMI 26.00 kg/m?  ?Physical Exam ?Vitals and nursing note reviewed.  ?Constitutional:   ?   General: He is not in acute distress. ?   Appearance: Normal appearance. He is not ill-appearing, toxic-appearing or diaphoretic.  ?HENT:  ?   Head: Normocephalic and atraumatic.  ?Eyes:  ?   Extraocular Movements: Extraocular movements intact.  ?   Pupils: Pupils are equal, round, and reactive to light.  ?Cardiovascular:  ?   Rate and Rhythm: Normal rate and regular rhythm.  ?   Pulses: Normal pulses.  ?   Heart sounds: No murmur heard. ?Pulmonary:  ?   Effort: Pulmonary effort is normal. No respiratory distress.  ?   Breath sounds: Normal breath sounds. No wheezing, rhonchi or rales.  ?Musculoskeletal:  ?   Right lower leg: No edema.  ?   Left lower leg: No edema.  ?Skin: ?   General: Skin is warm and dry.  ?   Findings: No rash.  ?Neurological:  ?   General: No focal deficit present.  ?   Mental Status: He is alert and oriented to person, place, and time. Mental status is at baseline.  ?   Cranial Nerves: No cranial nerve deficit.  ?   Sensory: No sensory deficit.  ?   Motor: No weakness.  ?   Coordination: Coordination normal.  ?   Gait: Gait normal.  ?   Deep Tendon Reflexes: Reflexes normal.  ?Psychiatric:     ?   Mood and Affect: Mood normal.     ?   Behavior: Behavior normal.     ?   Thought Content: Thought content normal.     ?   Cognition and Memory: Cognition normal.  ? ? ?No results found. ? ?Assessment/plan: ?[name] is a 56 y.o. male present for [facility] ?Essential hypertension/Overweight (BMI 25.0-29.9)/Family history of heart disease/former smoker ?Blood pressure is elevated as expected with him  not taking his medication.   ?He has now established with cardiology. ?-Strongly encouraged him to restart the verapamil 120 mg nightly and take the medication every night.  He has yet to take his medicine consistently

## 2022-03-31 ENCOUNTER — Encounter: Payer: Self-pay | Admitting: Gastroenterology

## 2022-04-09 ENCOUNTER — Encounter: Payer: Self-pay | Admitting: Emergency Medicine

## 2022-04-09 ENCOUNTER — Ambulatory Visit (INDEPENDENT_AMBULATORY_CARE_PROVIDER_SITE_OTHER): Payer: 59 | Admitting: Emergency Medicine

## 2022-04-09 DIAGNOSIS — R911 Solitary pulmonary nodule: Secondary | ICD-10-CM

## 2022-04-09 DIAGNOSIS — G479 Sleep disorder, unspecified: Secondary | ICD-10-CM

## 2022-04-09 DIAGNOSIS — G4733 Obstructive sleep apnea (adult) (pediatric): Secondary | ICD-10-CM | POA: Insufficient documentation

## 2022-04-09 DIAGNOSIS — R0600 Dyspnea, unspecified: Secondary | ICD-10-CM

## 2022-04-09 NOTE — Assessment & Plan Note (Signed)
May be in part related to his poorly controlled hypertension but question the pulmonary cause.  He did not have asthma history.  Minimal tobacco use.  We will perform pulmonary function testing to better characterize

## 2022-04-09 NOTE — Patient Instructions (Addendum)
We will arrange for home sleep test to evaluate for sleep apnea We will repeat your CT scan of the chest in November 2023 to follow small left upper lobe pulmonary nodule We will perform pulmonary function testing in next office visit Follow Dr. Lamonte Sakai in about 3 months with full PFT on the same day.

## 2022-04-09 NOTE — Addendum Note (Signed)
Addended by: Gavin Potters R on: 04/09/2022 03:36 PM   Modules accepted: Orders

## 2022-04-09 NOTE — Progress Notes (Signed)
Subjective:    Patient ID: [name], male    DOB: 04-12-1966, 56 y.o.   MRN: [id number]  HPI 56 year old former smoker (5 pack years) with poorly controlled hypertension, colon cancer that was treated.  PMH also hepatitis B, lymphadenitis.  He is referred today for evaluation of an abnormal CT scan of the chest. He reports that he was seen in the emergency department in [place] on 5/1 for left-sided chest discomfort associated with left hand numbness, dyspnea.  He was found to be profoundly hypertensive 235/120.  Chest x-ray showed questionable outlining of the cardiomediastinal structures raising possibility of pneumomediastinum.  For this reason the CT chest was done He reports intermittent SOB, cough in the am with mucous production.   CT chest done at [facility] on 03/23/2022.  The report is available but no images at this time.  Scan identified a 1 cm ill-defined left upper lobe pulmonary nodule with mild spiculation.  There was some thickening of the distal esophageal wall.  No hilar or mediastinal adenopathy.  He had cardiac scoring CT chest 07/31/21 that I reviewed > the nodule is not imaged   Review of Systems As per HPI  Past Medical History:  Diagnosis Date   Allergy    mild- uses honey    Chicken pox    Colon cancer ([place])    colon   COVID-19 01/10/2021   11/2020   Family history of colon cancer    Family history of prostate cancer    GERD (gastroesophageal reflux disease)    diet related    Hepatitis B    Hypertension    kidney stone    kidney stone    Lymphadenitis    Pneumothorax      Family History  Problem Relation Age of Onset   Arthritis Mother    COPD Mother    Heart disease Mother        Heart attack, no details   Colon cancer Mother 76   Colon polyps Daughter    Colon cancer Maternal Uncle 38       stage IV, d. 60   Prostate cancer Maternal Uncle        dx 40s, d. 3   Colon cancer Cousin        Stage IV, dx. 46s, d. 75   Esophageal cancer  Neg Hx    Pancreatic cancer Neg Hx    Rectal cancer Neg Hx    Stomach cancer Neg Hx     No family hx lung CA  Social History   Socioeconomic History   Marital status: Single    Spouse name: Not on file   Number of children: Not on file   Years of education: Not on file   Highest education level: Not on file  Occupational History   Not on file  Tobacco Use   Smoking status: Former   Smokeless tobacco: Never  Vaping Use   Vaping Use: Never used  Substance and Sexual Activity   Alcohol use: No    Alcohol/week: 0.0 standard drinks   Drug use: No   Sexual activity: Yes    Birth control/protection: None    Comment: Male partner  Other Topics Concern   Not on file  Social History Narrative   Single, 1 child.   Self-employed, college education.   Drinks caffeinated beverages, uses herbal remedies.   Wears his seatbelt, exercises greater than 3 times a week.   Smoke detector in the home, firearms in  the home in a locked cabinet.   Feels safe in his relationships.   Social Determinants of Health   Financial Resource Strain: Medium Risk   Difficulty of Paying Living Expenses: Somewhat hard  Food Insecurity: Food Insecurity Present   Worried About Running Out of Food in the Last Year: Sometimes true   Ran Out of Food in the Last Year: Never true  Transportation Needs: No Transportation Needs   Lack of Transportation (Medical): No   Lack of Transportation (Non-Medical): No  Physical Activity: Sufficiently Active   Days of Exercise per Week: 7 days   Minutes of Exercise per Session: 60 min  Stress: No Stress Concern Present   Feeling of Stress : Not at all  Social Connections: Moderately Integrated   Frequency of Communication with Friends and Family: More than three times a week   Frequency of Social Gatherings with Friends and Family: Once a week   Attends Religious Services: More than 4 times per year   Active Member of [employer] or Organizations: No   Attends Programme researcher, broadcasting/film/video: Not on file   Marital Status: Living with partner  Intimate Partner Violence: Not on file     Allergies  Allergen Reactions   Amlodipine Nausea And Vomiting    Stomach upset    Losartan     "Wet cough"   Valsartan     Shortness of breath and cough   Lisinopril Other (See Comments)    headache     Outpatient Medications Prior to Visit  Medication Sig Dispense Refill   Cholecalciferol (VITAMIN D-3 PO) Take by mouth.     cloNIDine (CATAPRES) 0.1 MG tablet Take 1 tablet (0.1 mg total) by mouth 2 (two) times daily. 180 tablet 3   co-enzyme Q-10 30 MG capsule Take 30 mg by mouth 3 (three) times daily.     verapamil (CALAN-SR) 120 MG CR tablet Take 1 tablet (120 mg total) by mouth at bedtime. 90 tablet 1   No facility-administered medications prior to visit.         Objective:   Physical Exam  Vitals:   04/09/22 1504  BP: (!) 160/88  Pulse: 69  Temp: 97.6 F (36.4 C)  TempSrc: Oral  SpO2: 97%  Weight: 169 lb 9.6 oz (76.9 kg)  Height: '5\' 7"'$  (1.702 m)   Gen: Pleasant, well-nourished, in no distress,  normal affect  ENT: No lesions,  mouth clear,  oropharynx clear, no postnasal drip  Neck: No JVD, no stridor  Lungs: No use of accessory muscles, no crackles or wheezing on normal respiration, no wheeze on forced expiration  Cardiovascular: RRR, heart sounds normal, no murmur or gallops, no peripheral edema  Musculoskeletal: No deformities, no cyanosis or clubbing  Neuro: alert, awake, non focal  Skin: Warm, no lesions or rash     Assessment & Plan:  Pulmonary nodule Incidental finding and a low risk patient with minimal tobacco use and no family history.  Needs to be followed.  I we will plan to repeat his CT scan of the chest in 6 months to follow for interval change.  Depending on that result we may perform PET scan, consider bronchoscopy, consider referral for thoracic surgery.  Alternatively if reassuring we will follow-up for stability  on serial films.  Dyspnea May be in part related to his poorly controlled hypertension but question the pulmonary cause.  He did not have asthma history.  Minimal tobacco use.  We will perform pulmonary function testing to  better characterize  Sleep disturbance Wakes up frequently, sometimes gasping for breath and short of breath.  Unclear whether he snores.  I believe he would benefit from a home sleep study and we will arrange for this.   Baltazar Apo, MD, PhD 04/09/2022, 3:33 PM [facility] [phone] or if no answer before 7:00PM call [phone] For any issues after 7:00PM please call eLink [phone]

## 2022-04-09 NOTE — Assessment & Plan Note (Signed)
Incidental finding and a low risk patient with minimal tobacco use and no family history.  Needs to be followed.  I we will plan to repeat his CT scan of the chest in 6 months to follow for interval change.  Depending on that result we may perform PET scan, consider bronchoscopy, consider referral for thoracic surgery.  Alternatively if reassuring we will follow-up for stability on serial films.

## 2022-04-09 NOTE — Assessment & Plan Note (Signed)
Wakes up frequently, sometimes gasping for breath and short of breath.  Unclear whether he snores.  I believe he would benefit from a home sleep study and we will arrange for this.

## 2022-04-24 ENCOUNTER — Ambulatory Visit: Payer: 59 | Admitting: Gastroenterology

## 2022-04-24 ENCOUNTER — Encounter: Payer: Self-pay | Admitting: Family Medicine

## 2022-04-24 ENCOUNTER — Encounter: Payer: Self-pay | Admitting: Gastroenterology

## 2022-04-24 VITALS — BP 170/92 | HR 48 | Ht 67.0 in | Wt 166.6 lb

## 2022-04-24 DIAGNOSIS — R933 Abnormal findings on diagnostic imaging of other parts of digestive tract: Secondary | ICD-10-CM | POA: Diagnosis not present

## 2022-04-24 NOTE — Patient Instructions (Signed)
If you are age 57 or older, your body mass index should be between 23-30. Your Body mass index is 26.09 kg/m. If this is out of the aforementioned range listed, please consider follow up with your Primary Care Provider.  If you are age 5 or younger, your body mass index should be between 19-25. Your Body mass index is 26.09 kg/m. If this is out of the aformentioned range listed, please consider follow up with your Primary Care Provider.   ________________________________________________________  The [facility] providers would like to encourage you to use [facility] to communicate with providers for non-urgent requests or questions.  Due to long hold times on the telephone, sending your provider a message by [facility] may be a faster and more efficient way to get a response.  Please allow 48 business hours for a response.  Please remember that this is for non-urgent requests.  _______________________________________________________  [name] have been scheduled for an endoscopy. Please follow written instructions given to you at your visit today. If you use inhalers (even only as needed), please bring them with you on the day of your procedure.  It was a pleasure to see you today!  Thank you for trusting me with your gastrointestinal care!

## 2022-04-24 NOTE — Progress Notes (Signed)
[facility] progress note:  History: [name] 04/24/2022  Referring provider: Ma Hillock, DO  Reason for consult/chief complaint: Abnormal CT (Patient seen recently in the ER and had a CT which showed thickening in the Esophagus. He states that he has not had any difficulties with swallowing.)   Subjective  HPI: [name] is referred to [place] at this time for abnormal esophageal finding on CT scan chest.  From PCP office note dated 03/25/2022: "[name] is a 56 y.o. male present for ED follow-up visit Chest pain/hypertension/overweight/former smoker/fhx heart disease in mother:  Patient presented to ED with chest pain on 03/23/2022.  CBC, CMP, troponin, lipase, magnesium were all normal.  CT resulted with incidental finding of esophageal thickening and a pulmonary spiculated lesion.  Patient admits he had not been taking his hypertensive medicine.  He reports he noticed some chest discomfort and shortness of breath so he took his blood pressure at home and it was reportedly 235/120.  He also then noticed left hand numbness."  Patient has established with cardiology, last seen 03/16/2022, and was encouraged by primary care to take his antihypertensives regularly.  Dr. Raoul Pitch for the patient pulmonary, who saw him on 04/09/2022.  While he had accelerated hypertension during the ED visit for chest pain, there was some thought that perhaps the esophageal findings might represent an underlying cause of the chest pain.  He reported having had an upper endoscopy for what sounds of a food impaction in his late 54s.  However, he was drinking alcohol more heavily and smoking at that time as well. He denies chronic heartburn, dysphagia, odynophagia, nausea vomiting early satiety or weight loss.  He is not taking the clonidine as recommended for his hypertension.  [name] seems frustrated that his cardiovascular evaluation has not identified a specific cause for his high blood  pressure rather than something requiring long-term medication.  I encouraged him to discuss this further with either cardiology or primary care.   [name] was diagnosed with sigmoid colon adenocarcinoma in 2008 and underwent resection with primary anastomosis.  On surveillance colonoscopy in May 2017, he had a subcentimeter cecal SSP. Last colonoscopy August 2022 findings: The digital rectal exam findings include palpable anastomosis with pelvic fibrosis. - A 2 mm polyp was found in the cecum. The polyp was flat. The polyp was removed with a cold biopsy forceps. Resection and retrieval were complete. - A 4 mm polyp was found in the ileocecal valve. The polyp was sessile. The polyp was removed with a cold snare. Resection and retrieval were complete. - There was evidence of a prior end-to-end colo-rectal anastomosis in the distal rectum. This was patent and was characterized by healthy appearing mucosa and visible sutures. - Retroflexion in the rectum was not performed due to anatomy. - The exam was otherwise without abnormality. (Pathology result below)  [facility] provider [facility] on 03/23/2022 reviewed. ROS:  Review of Systems  Constitutional:  Negative for appetite change and unexpected weight change.  HENT:  Negative for mouth sores and voice change.   Eyes:  Negative for pain and redness.  Respiratory:  Negative for cough and shortness of breath.   Cardiovascular:  Negative for chest pain and palpitations.  Genitourinary:  Negative for dysuria and hematuria.  Musculoskeletal:  Negative for arthralgias and myalgias.  Skin:  Negative for pallor and rash.  Neurological:  Negative for weakness and headaches.  Hematological:  Negative for adenopathy.    Past Medical History:  Past Medical History:  Diagnosis Date   Allergy    mild- uses honey    Chicken pox    Colon cancer ([place])    colon   COVID-19 01/10/2021   11/2020   Family history of colon cancer     Family history of prostate cancer    GERD (gastroesophageal reflux disease)    diet related    Hepatitis B    Hypertension    kidney stone    kidney stone    Lymphadenitis    Pneumothorax      Past Surgical History: Past Surgical History:  Procedure Laterality Date   COLON SURGERY  2008   COLONOSCOPY     PARTIAL COLECTOMY     colon caner   POLYPECTOMY       Family History: Family History  Problem Relation Age of Onset   Arthritis Mother    COPD Mother    Heart disease Mother        Heart attack, no details   Colon cancer Mother 67   Colon polyps Daughter    Colon cancer Maternal Uncle 37       stage IV, d. 45   Prostate cancer Maternal Uncle        dx 34s, d. 57   Colon cancer Cousin        Stage IV, dx. 51s, d. 19   Esophageal cancer Neg Hx    Pancreatic cancer Neg Hx    Rectal cancer Neg Hx    Stomach cancer Neg Hx     Social History: Social History   Socioeconomic History   Marital status: Single    Spouse name: Not on file   Number of children: Not on file   Years of education: Not on file   Highest education level: Not on file  Occupational History   Not on file  Tobacco Use   Smoking status: Former   Smokeless tobacco: Never  Vaping Use   Vaping Use: Never used  Substance and Sexual Activity   Alcohol use: No    Alcohol/week: 0.0 standard drinks   Drug use: No   Sexual activity: Yes    Birth control/protection: None    Comment: Male partner  Other Topics Concern   Not on file  Social History Narrative   Single, 1 child.   Self-employed, college education.   Drinks caffeinated beverages, uses herbal remedies.   Wears his seatbelt, exercises greater than 3 times a week.   Smoke detector in the home, firearms in the home in a locked cabinet.   Feels safe in his relationships.   Social Determinants of Health   Financial Resource Strain: Medium Risk   Difficulty of Paying Living Expenses: Somewhat hard  Food Insecurity: Food  Insecurity Present   Worried About Running Out of Food in the Last Year: Sometimes true   Ran Out of Food in the Last Year: Never true  Transportation Needs: No Transportation Needs   Lack of Transportation (Medical): No   Lack of Transportation (Non-Medical): No  Physical Activity: Sufficiently Active   Days of Exercise per Week: 7 days   Minutes of Exercise per Session: 60 min  Stress: No Stress Concern Present   Feeling of Stress : Not at all  Social Connections: Moderately Integrated   Frequency of Communication with Friends and Family: More than three times a week   Frequency of Social Gatherings with Friends and Family: Once a week   Attends Religious Services: More than  4 times per year   Active Member of Clubs or Organizations: No   Attends Archivist Meetings: Not on file   Marital Status: Living with partner    Allergies: Allergies  Allergen Reactions   Amlodipine Nausea And Vomiting    Stomach upset    Losartan     "Wet cough"   Valsartan     Shortness of breath and cough   Lisinopril Other (See Comments)    headache    Outpatient Meds: Current Outpatient Medications  Medication Sig Dispense Refill   verapamil (CALAN-SR) 120 MG CR tablet Take 1 tablet (120 mg total) by mouth at bedtime. 90 tablet 1   No current facility-administered medications for this visit.      ___________________________________________________________________ Objective   Exam:  BP (!) 170/92   Pulse (!) 48   Ht '5\' 7"'$  (1.702 m)   Wt 166 lb 9.6 oz (75.6 kg)   BMI 26.09 kg/m  Wt Readings from Last 3 Encounters:  04/24/22 166 lb 9.6 oz (75.6 kg)  04/09/22 169 lb 9.6 oz (76.9 kg)  03/25/22 166 lb (75.3 kg)    General: Well-appearing, normal vocal quality Eyes: sclera anicteric, no redness ENT: oral mucosa moist without lesions, no cervical or supraclavicular lymphadenopathy CV: Regular, no murmur, no JVD, no peripheral edema Resp: clear to auscultation  bilaterally, normal RR and effort noted GI: soft, no tenderness, with active bowel sounds. No guarding or palpable organomegaly noted. Skin; warm and dry, no rash or jaundice noted Neuro: awake, alert and oriented x 3. Normal gross motor function and fluent speech  Labs:  August 2022 colonoscopy pathology  1. Surgical [P], colon, cecum, polyp (1) - SESSILE SERRATED POLYP WITHOUT CYTOLOGIC DYSPLASIA 2. Surgical [P], colon, ileocecal valve, polyp (1) - TUBULAR ADENOMA(S) WITHOUT HIGH-GRADE DYSPLASIA OR MALIGNANCY   Radiology:  COMPARISON: None. INDICATION: possible pneumomediastinum on CXR TECHNIQUE: CT CHEST W IV CONTRAST -  Contrast: 65 mL IOPAMIDOL 76 % IV SOLN Radiation dose reduction was utilized (automated exposure control, mA or kV adjustment based on patient size, or iterative image reconstruction).  Exam date/time: 03/23/2022 10:52 PM   FINDINGS:   SUPPORT APPARATUS: N.A.   LUNGS/PLEURA: No focal airspace opacities/consolidations. No pneumothorax.  On image 54 of series 2 there is a 1.0 cm left upper lobe pulmonary nodule with ill-defined borders and mild spiculation. No pleural effusions.  HEART/MEDIASTINUM:  There is no evidence of pneumomediastinum. Cardiac size is normal.  There are no coronary artery calcifications No acute thoracic aortic abnormalities.  No hilar, mediastinal, or axillary lymphadenopathy.  There is wall thickening of the distal esophagus which can be seen with esophagitis versus gastric reflux.  MUSCULOSKELETAL: No acute or destructive osseous processes.  CHRONIC/INCIDENTAL FINDINGS: N.A.  Impression:  IMPRESSION:  No evidence of pneumomediastinum.  No acute infiltrate or pleural effusion.  1.0 cm left upper lobe pulmonary nodule with ill-defined borders and spiculation. By the Fleischner criteria at minimum I follow-up CT scan of the chest in 3 months is recommended. Alternatively follow-up PET imaging imaging may be obtained to  evaluate for neoplasm.  Wall thickening of the distal esophagus which can be seen with esophagitis versus gastric reflux.  ####CODE SIGNIFICANT REPORT#### 1.0 cm spiculated nodule in the left upper lobe  Electronically Signed by: Erline Levine on 03/23/2022 11:37 PM   _________________________________   Cardiac CT calcium score 0 in September 2022  ____________________  Additional labs during ED visit on 03/23/2022  CBC  Creatinine 1.27, remainder CMP normal  Nonischemic EKG  Troponin negative x2   Assessment: Encounter Diagnosis  Name Primary?   Abnormal finding on GI tract imaging Yes  Recent ED visit for accelerated hypertension and chest pain without acute coronary syndrome. Low risk cardiac CT scan September 2022.  I believe this recent episode of chest pain prompting an ED visit was from accelerated hypertension and not an underlying upper digestive disorder.  He has no chronic upper digestive symptoms, and his CT finding of reported distal esophageal wall thickening might not turn out to be anything.  Nevertheless, does need upper endoscopy for further evaluation to rule out reflux esophagitis and neoplasia.  Plan:  He was agreeable to an upper endoscopy after discussion of procedure and risks.  The benefits and risks of the planned procedure were described in detail with the patient or (when appropriate) their health care proxy.  Risks were outlined as including, but not limited to, bleeding, infection, perforation, adverse medication reaction leading to cardiac or pulmonary decompensation, pancreatitis (if ERCP).  The limitation of incomplete mucosal visualization was also discussed.  No guarantees or warranties were given.  We discussed the serious nature of his hypertension, and I strongly suggested that he take his clonidine as recommended and confirm with primary care and/or cardiology if he has further concerns and questions about the condition and his  work-up   Nelida Meuse III  CC: Dr. Howard Pouch

## 2022-04-29 ENCOUNTER — Encounter: Payer: Self-pay | Admitting: Gastroenterology

## 2022-04-29 ENCOUNTER — Ambulatory Visit (AMBULATORY_SURGERY_CENTER): Payer: 59 | Admitting: Gastroenterology

## 2022-04-29 ENCOUNTER — Encounter: Payer: Self-pay | Admitting: Cardiology

## 2022-04-29 VITALS — BP 154/92 | HR 46 | Resp 10 | Ht 67.0 in | Wt 166.0 lb

## 2022-04-29 DIAGNOSIS — K2270 Barrett's esophagus without dysplasia: Secondary | ICD-10-CM | POA: Diagnosis not present

## 2022-04-29 DIAGNOSIS — K200 Eosinophilic esophagitis: Secondary | ICD-10-CM | POA: Diagnosis not present

## 2022-04-29 DIAGNOSIS — K2100 Gastro-esophageal reflux disease with esophagitis, without bleeding: Secondary | ICD-10-CM | POA: Diagnosis not present

## 2022-04-29 DIAGNOSIS — R933 Abnormal findings on diagnostic imaging of other parts of digestive tract: Secondary | ICD-10-CM

## 2022-04-29 MED ORDER — OMEPRAZOLE 40 MG PO CPDR: 40.0000 mg | DELAYED_RELEASE_CAPSULE | Freq: Two times a day (BID) | ORAL | 1 refills | Status: DC

## 2022-04-29 MED ORDER — SODIUM CHLORIDE 0.9 % IV SOLN: 500.0000 mL | Freq: Once | INTRAVENOUS | Status: DC

## 2022-04-29 NOTE — Progress Notes (Signed)
Clinical details of this patient and my comprehensive office note of 04/24/22 The same day he sent a portal message to his primary care provider with continued elevated blood pressure recordings.  They recommended he proceed to the emergency department, which he did not do. His blood pressure is elevated today, but he denies headache, visual disturbance chest pain or dyspnea.  The patient is appropriate for an endoscopic procedure in the ambulatory setting.  - Wilfrid Lund, MD

## 2022-04-29 NOTE — Progress Notes (Signed)
VSS, transported to PACU °

## 2022-04-29 NOTE — Op Note (Signed)
[facility] Patient Name: [name] Procedure Date: 04/29/2022 9:55 AM MRN: [id number] Endoscopist: Mallie Mussel L. Loletha Carrow , MD Age: 56 Referring MD:  Date of Birth: 1966/10/27 Gender: Male Account #: [id number] Procedure:                Upper GI endoscopy Indications:              Abnormal CT of the GI tract (reported distal                            esophageal thickening no CT chest - no UGI symptoms Medicines:                Monitored Anesthesia Care Procedure:                Pre-Anesthesia Assessment:                           - Prior to the procedure, a History and Physical                            was performed, and patient medications and                            allergies were reviewed. The patient's tolerance of                            previous anesthesia was also reviewed. The risks                            and benefits of the procedure and the sedation                            options and risks were discussed with the patient.                            All questions were answered, and informed consent                            was obtained. Prior Anticoagulants: The patient has                            taken no previous anticoagulant or antiplatelet                            agents. ASA Grade Assessment: III - A patient with                            severe systemic disease. After reviewing the risks                            and benefits, the patient was deemed in                            satisfactory condition to undergo the procedure.  After obtaining informed consent, the endoscope was                            passed under direct vision. Throughout the                            procedure, the patient's blood pressure, pulse, and                            oxygen saturations were monitored continuously. The                            GIF [id number] #[id number] was introduced through the                            mouth, and  advanced to the second part of duodenum.                            The upper GI endoscopy was accomplished without                            difficulty. The patient tolerated the procedure                            well. Scope In: Scope Out: Findings:                 Mucosal changes characterized by granularity and                            longitudinal markings were found in the middle                            third of the esophagus. Biopsies were taken with a                            cold forceps for histology. (Jar 4)                           LA Grade D (one or more mucosal breaks involving at                            least 75% of esophageal circumference) esophagitis                            with no bleeding was found in the distal esophagus.                            Ulcerated with adjacent nodular tissue at 35cm from                            incisors. Biopsies were taken with a cold forceps  for histology. (Jar 3)                           There were esophageal mucosal changes suspicious                            for long-segment Barrett's esophagus present in the                            lower third of the esophagus. The maximum                            longitudinal extent of these mucosal changes was 3                            cm in length. Several biopsies were obtained at 36                            and 38 cm from the incisors with cold forceps for                            evaluation to rule out Barrett's Esophagus. (no                            raised or suspicious areas within the Barrett's                            under WL or NBI)                           A 1-2 cm hiatal hernia was present.                           The stomach was normal.                           The cardia and gastric fundus were normal on                            retroflexion.                           The examined duodenum was  normal. Complications:            No immediate complications. Estimated Blood Loss:     Estimated blood loss was minimal. Impression:               - Granular, longitudinally marked mucosa in the                            esophagus. Biopsied.                           - LA Grade D reflux esophagitis with no bleeding.  Biopsied.                           - Esophageal mucosal changes suspicious for                            long-segment Barrett's esophagus.                           - 1 cm hiatal hernia.                           - Normal stomach.                           - Normal examined duodenum.                           - Several biopsies were obtained at 36 and 38 cm                            from the incisors. Recommendation:           - Patient has a contact number available for                            emergencies. The signs and symptoms of potential                            delayed complications were discussed with the                            patient. Return to normal activities tomorrow.                            Written discharge instructions were provided to the                            patient.                           - Resume previous diet.                           - Continue present medications.                           - Use Prilosec (omeprazole) 40 mg PO BID. Disp #60,                            RF 1                           - Await pathology results.                           - Return to my office at appointment to be  scheduled. Karly Pitter L. Loletha Carrow, MD 04/29/2022 10:44:39 AM This report has been signed electronically.

## 2022-04-29 NOTE — Progress Notes (Signed)
Called to room to assist during endoscopic procedure.  Patient ID and intended procedure confirmed with present staff. Received instructions for my participation in the procedure from the performing physician.  

## 2022-04-29 NOTE — Patient Instructions (Signed)
Await pathology Please read over handouts about esophagitis and hiatal hernias  Continue your normal medications  Take Prilosec 40 mg twice daily- take 1 tablet 30 minutes before breakfast and supper   YOU HAD AN ENDOSCOPIC PROCEDURE TODAY AT [facility]:   Refer to the procedure report that was given to you for any specific questions about what was found during the examination.  If the procedure report does not answer your questions, please call your gastroenterologist to clarify.  If you requested that your care partner not be given the details of your procedure findings, then the procedure report has been included in a sealed envelope for you to review at your convenience later.  YOU SHOULD EXPECT: Some feelings of bloating in the abdomen. Passage of more gas than usual.  Walking can help get rid of the air that was put into your GI tract during the procedure and reduce the bloating.  Please Note:  You might notice some irritation and congestion in your nose or some drainage.  This is from the oxygen used during your procedure.  There is no need for concern and it should clear up in a day or so.  SYMPTOMS TO REPORT IMMEDIATELY:  Following upper endoscopy (EGD)  Vomiting of blood or coffee ground material  New chest pain or pain under the shoulder blades  Painful or persistently difficult swallowing  New shortness of breath  Fever of 100F or higher  Black, tarry-looking stools  For urgent or emergent issues, a gastroenterologist can be reached at any hour by calling [phone]. Do not use MyChart messaging for urgent concerns.    DIET:  We do recommend a small meal at first, but then you may proceed to your regular diet.  Drink plenty of fluids but you should avoid alcoholic beverages for 24 hours.  ACTIVITY:  You should plan to take it easy for the rest of today and you should NOT DRIVE or use heavy machinery until tomorrow (because of the sedation medicines  used during the test).    FOLLOW UP: Our staff will call the number listed on your records 24-72 hours following your procedure to check on you and address any questions or concerns that you may have regarding the information given to you following your procedure. If we do not reach you, we will leave a message.  We will attempt to reach you two times.  During this call, we will ask if you have developed any symptoms of COVID 19. If you develop any symptoms (ie: fever, flu-like symptoms, shortness of breath, cough etc.) before then, please call [phone].  If you test positive for Covid 19 in the 2 weeks post procedure, please call and report this information to [place].    If any biopsies were taken you will be contacted by phone or by letter within the next 1-3 weeks.  Please call us at [phone] if you have not heard about the biopsies in 3 weeks.    SIGNATURES/CONFIDENTIALITY: You and/or your care partner have signed paperwork which will be entered into your electronic medical record.  These signatures attest to the fact that that the information above on your After Visit Summary has been reviewed and is understood.  Full responsibility of the confidentiality of this discharge information lies with you and/or your care-partner.

## 2022-04-30 ENCOUNTER — Telehealth: Payer: Self-pay

## 2022-04-30 NOTE — Telephone Encounter (Signed)
  Follow up Call-     07/22/2021   10:06 AM  Call back number  Post procedure Call Back phone  # [phone]  Permission to leave phone message Yes     Patient questions:  Do you have a fever, pain , or abdominal swelling? No. Pain Score  0 *  Have you tolerated food without any problems? Yes.    Have you been able to return to your normal activities? Yes.    Do you have any questions about your discharge instructions: Diet   No. Medications  No. Follow up visit  No.  Do you have questions or concerns about your Care? No.  Actions: * If pain score is 4 or above: No action needed, pain <4.

## 2022-04-30 NOTE — Telephone Encounter (Signed)
Per 04/29/22 procedure report - Return at appt to be scheduled  Called and spoke with patient to schedule appt. Pt has been scheduled for a follow up appt with Dr. Loletha Carrow on Monday, 06/22/22 at 1:20 pm. Pt verbalized understanding and had no concerns at the end of the call.

## 2022-05-01 ENCOUNTER — Encounter: Payer: Self-pay | Admitting: Gastroenterology

## 2022-05-06 NOTE — Progress Notes (Signed)
Cardiology Office Note   Date:  05/07/2022   ID:  [name], DOB 10/29/1966, MRN [id number]  PCP:  Ma Hillock, DO  Cardiologist:   Minus Breeding, MD Referring:  Ma Hillock, DO   Chief Complaint  Patient presents with   Hypertension      History of Present Illness: [name] is a 56 y.o. male who presents for evaluation of hypertension.   He has been intolerant to medications.  Is been on multiple drugs including amlodipine, losartan and lisinopril for various reasons he did not tolerate.  I tried clonidine and he did not want to take this.  He has been on 120 of verapamil but he says is not controlling his blood pressure.  He wants to try some nutraceutical therapies.  He is concerned about a secondary cause.  He does snore, he has daytime somnolence, there is apparently been witnessed apnea (STOP-BANG 5).  He has been taking his verapamil per his report.  He is not really having any side effects to this.  He denies any chest pressure, neck or arm discomfort.  There is no new palpitations, presyncope or syncope.  He has no new shortness of breath, PND or orthopnea.  He said no weight gain or edema.   Past Medical History:  Diagnosis Date   Allergy    mild- uses honey    Chicken pox    Colon cancer ([place])    colon   COVID-19 01/10/2021   11/2020   Family history of colon cancer    Family history of prostate cancer    GERD (gastroesophageal reflux disease)    diet related    Hepatitis B    Hypertension    kidney stone    kidney stone    Lymphadenitis    Pneumothorax     Past Surgical History:  Procedure Laterality Date   COLON SURGERY  2008   COLONOSCOPY     PARTIAL COLECTOMY     colon caner   POLYPECTOMY       Current Outpatient Medications  Medication Sig Dispense Refill   omeprazole (PRILOSEC) 40 MG capsule Take 1 capsule (40 mg total) by mouth 2 (two) times daily. 60 capsule 1   verapamil (CALAN-SR) 120 MG CR tablet Take 1 tablet (120  mg total) by mouth at bedtime. 90 tablet 1   No current facility-administered medications for this visit.    Allergies:   Amlodipine, Losartan, Valsartan, and Lisinopril     ROS:  Please see the history of present illness.   Otherwise, review of systems are positive for none.   All other systems are reviewed and negative.    PHYSICAL EXAM: VS:  BP (!) 170/80   Pulse (!) 58   Ht '5\' 7"'$  (1.702 m)   Wt 166 lb 12.8 oz (75.7 kg)   SpO2 98%   BMI 26.12 kg/m  , BMI Body mass index is 26.12 kg/m. GENERAL:  Well appearing NECK:  No jugular venous distention, waveform within normal limits, carotid upstroke brisk and symmetric, no bruits, no thyromegaly LUNGS:  Clear to auscultation bilaterally CHEST:  Unremarkable HEART:  PMI not displaced or sustained,S1 and S2 within normal limits, no S3, no S4, no clicks, no rubs, no murmurs ABD:  Flat, positive bowel sounds normal in frequency in pitch, no bruits, no rebound, no guarding, no midline pulsatile mass, no hepatomegaly, no splenomegaly EXT:  2 plus pulses throughout, no edema, no cyanosis no clubbing  EKG:  EKG is  ordered today. The ekg ordered today demonstrates sinus bradycardia, rate 58, axis within normal limits, intervals within normal limits, no acute ST-T wave changes.   Recent Labs: 01/14/2022: ALT 28; BUN 18; Creatinine, Ser 1.10; Hemoglobin 14.3; Platelets 201.0; Potassium 3.9; Sodium 140; TSH 1.10    Lipid Panel    Component Value Date/Time   CHOL 160 01/14/2022 0839   TRIG 60.0 01/14/2022 0839   TRIG 77 12/03/2006 1156   HDL 45.30 01/14/2022 0839   CHOLHDL 4 01/14/2022 0839   VLDL 12.0 01/14/2022 0839   LDLCALC 103 (H) 01/14/2022 0839   LDLDIRECT 101.0 02/10/2017 0912      Wt Readings from Last 3 Encounters:  05/07/22 166 lb 12.8 oz (75.7 kg)  04/29/22 166 lb (75.3 kg)  04/24/22 166 lb 9.6 oz (75.6 kg)      Other studies Reviewed: Additional studies/ records that were reviewed today include: None Review of  the above records demonstrates:  NA    ASSESSMENT AND PLAN:  Difficult to control HTN: The patient is very reluctant to take any more medications.  I will increase verapamil but he does not want to do this yet.  He wants to try his nutraceuticals but he thinks he will continue the current dose of verapamil.  He does have potential sleep apnea and I will order a home sleep test.  I will also look for secondary causes with renal artery Dopplers and also urinalysis.  We talked again about therapeutic lifestyle changes but he is not overweight, he is very physically active.  He could reduce his salt.  I doubt that there is a secondary cause but will look forward as above.  We had a long discussion about essential hypertension and how it requires oftentimes a few medications we have to look for combinations that he tolerates.  He is very frustrated about this.   Current medicines are reviewed at length with the patient today.  The patient does not have concerns regarding medicines.  The following changes have been made:   None  Labs/ tests ordered today include:     Orders Placed This Encounter  Procedures   Urinalysis   EKG 12-Lead   Split night study   VAS US RENAL ARTERY DUPLEX     Disposition:   FU with me in 3 months.     Signed, Minus Breeding, MD  05/07/2022 10:20 AM    [facility]

## 2022-05-07 ENCOUNTER — Ambulatory Visit: Payer: 59 | Admitting: Cardiology

## 2022-05-07 ENCOUNTER — Encounter: Payer: Self-pay | Admitting: Cardiology

## 2022-05-07 ENCOUNTER — Ambulatory Visit (INDEPENDENT_AMBULATORY_CARE_PROVIDER_SITE_OTHER): Payer: 59 | Admitting: Cardiology

## 2022-05-07 VITALS — BP 170/80 | HR 58 | Ht 67.0 in | Wt 166.8 lb

## 2022-05-07 DIAGNOSIS — R0683 Snoring: Secondary | ICD-10-CM | POA: Diagnosis not present

## 2022-05-07 DIAGNOSIS — I10 Essential (primary) hypertension: Secondary | ICD-10-CM | POA: Diagnosis not present

## 2022-05-07 DIAGNOSIS — R400 Somnolence: Secondary | ICD-10-CM | POA: Diagnosis not present

## 2022-05-07 NOTE — Patient Instructions (Signed)
Medication Instructions:  Your Physician recommend you continue on your current medication as directed.    *If you need a refill on your cardiac medications before your next appointment, please call your pharmacy*   Lab Work: Your physician recommends that you return for lab work today (urinalysis)  If you have labs (blood work) drawn today and your tests are completely normal, you will receive your results only by: MyChart Message (if you have MyChart) OR A paper copy in the mail If you have any lab test that is abnormal or we need to change your treatment, we will call you to review the results.   Testing/Procedures: Your physician has requested that you have a renal artery duplex. During this test, an ultrasound is used to evaluate blood flow to the kidneys. Allow one hour for this exam. Do not eat after midnight the day before and avoid carbonated beverages. Take your medications as you usually do.  [address]  Your physician has recommended that you have a sleep study. This test records several body functions during sleep, including: brain activity, eye movement, oxygen and carbon dioxide blood levels, heart rate and rhythm, breathing rate and rhythm, the flow of air through your mouth and nose, snoring, body muscle movements, and chest and belly movement. [facility]    Follow-Up: At [facility], you and your health needs are our priority.  As part of our continuing mission to provide you with exceptional heart care, we have created designated Provider Care Teams.  These Care Teams include your primary Cardiologist (physician) and Advanced Practice Providers (APPs -  Physician Assistants and Nurse Practitioners) who all work together to provide you with the care you need, when you need it.  We recommend signing up for the patient portal called "MyChart".  Sign up information is provided on this After Visit Summary.  MyChart is used to connect with  patients for Virtual Visits (Telemedicine).  Patients are able to view lab/test results, encounter notes, upcoming appointments, etc.  Non-urgent messages can be sent to your provider as well.   To learn more about what you can do with MyChart, go to [email or web address].    Your next appointment:   3 month(s)  The format for your next appointment:   In Person  Provider:   Minus Breeding, MD {

## 2022-05-08 LAB — URINALYSIS
Bilirubin, UA: NEGATIVE
Glucose, UA: NEGATIVE
Ketones, UA: NEGATIVE
Leukocytes,UA: NEGATIVE
Nitrite, UA: NEGATIVE
Protein,UA: NEGATIVE
RBC, UA: NEGATIVE
Specific Gravity, UA: 1.01 (ref 1.005–1.030)
Urobilinogen, Ur: 0.2 mg/dL (ref 0.2–1.0)
pH, UA: 6 (ref 5.0–7.5)

## 2022-05-11 NOTE — Addendum Note (Signed)
Addended by: Beatrix Fetters on: 05/11/2022 03:47 PM   Modules accepted: Orders

## 2022-05-12 ENCOUNTER — Other Ambulatory Visit: Payer: Self-pay | Admitting: Cardiology

## 2022-05-12 ENCOUNTER — Telehealth: Payer: Self-pay | Admitting: *Deleted

## 2022-05-12 DIAGNOSIS — R0683 Snoring: Secondary | ICD-10-CM

## 2022-05-12 DIAGNOSIS — I10 Essential (primary) hypertension: Secondary | ICD-10-CM

## 2022-05-12 DIAGNOSIS — G479 Sleep disorder, unspecified: Secondary | ICD-10-CM

## 2022-05-12 DIAGNOSIS — R400 Somnolence: Secondary | ICD-10-CM

## 2022-05-12 NOTE — Telephone Encounter (Signed)
Per [employer] in lab split night sleep study denied. Ok to do HST sleep which does not need a PA. HST ordered.

## 2022-05-13 ENCOUNTER — Ambulatory Visit (INDEPENDENT_AMBULATORY_CARE_PROVIDER_SITE_OTHER): Payer: 59 | Admitting: Family Medicine

## 2022-05-13 ENCOUNTER — Encounter: Payer: Self-pay | Admitting: Family Medicine

## 2022-05-13 VITALS — BP 165/89 | HR 59 | Temp 98.1°F | Ht 67.0 in | Wt 166.0 lb

## 2022-05-13 DIAGNOSIS — I10 Essential (primary) hypertension: Secondary | ICD-10-CM

## 2022-05-13 DIAGNOSIS — J301 Allergic rhinitis due to pollen: Secondary | ICD-10-CM

## 2022-05-13 DIAGNOSIS — Z8249 Family history of ischemic heart disease and other diseases of the circulatory system: Secondary | ICD-10-CM | POA: Diagnosis not present

## 2022-05-13 DIAGNOSIS — R0600 Dyspnea, unspecified: Secondary | ICD-10-CM

## 2022-05-13 MED ORDER — IRBESARTAN 150 MG PO TABS: 150.0000 mg | ORAL_TABLET | Freq: Every day | ORAL | 1 refills | Status: DC

## 2022-05-13 NOTE — Progress Notes (Unsigned)
SAMIN MILKE , [birth year], 56 y.o., male MRN: [id number] Patient Care Team    Relationship Specialty Notifications Start End  Ma Hillock, DO PCP - General Family Medicine  02/10/16   Minus Breeding, MD PCP - Cardiology Cardiology  05/07/22   Doran Stabler, MD Consulting Physician Gastroenterology  04/19/18   Ladell Pier, MD Consulting Physician Oncology  05/03/19     Chief Complaint  Patient presents with   Breathing Problem    Shortness of breath still occurring since ED visit on 5/1; subsides sometimes occurring every other breath.     Subjective: Pt presents for an OV with complaints of *** of *** duration.  Associated symptoms include ***.  Pt has tried *** to ease their symptoms.      01/14/2022    8:25 AM 01/10/2021    8:54 AM 05/02/2019   11:01 AM 04/19/2018    9:00 AM 02/10/2017    8:14 AM  Depression screen PHQ 2/9  Decreased Interest 0 0 0 0 0  Down, Depressed, Hopeless 0 0 0 0 0  PHQ - 2 Score 0 0 0 0 0  Altered sleeping 0      Tired, decreased energy 0      Change in appetite 0      Feeling bad or failure about yourself  0      Trouble concentrating 0      Moving slowly or fidgety/restless 0      Suicidal thoughts 0      PHQ-9 Score 0        Allergies  Allergen Reactions   Amlodipine Nausea And Vomiting    Stomach upset    Losartan     "Wet cough"   Valsartan     Shortness of breath and cough   Lisinopril Other (See Comments)    headache   Social History   Social History Narrative   Single, 1 child.   Self-employed, college education.   Drinks caffeinated beverages, uses herbal remedies.   Wears his seatbelt, exercises greater than 3 times a week.   Smoke detector in the home, firearms in the home in a locked cabinet.   Feels safe in his relationships.   Past Medical History:  Diagnosis Date   Allergy    mild- uses honey    Chicken pox    Colon cancer ([place])    colon   COVID-19 01/10/2021   11/2020   Family history  of colon cancer    Family history of prostate cancer    GERD (gastroesophageal reflux disease)    diet related    Hepatitis B    Hypertension    kidney stone    kidney stone    Lymphadenitis    Pneumothorax    Past Surgical History:  Procedure Laterality Date   COLON SURGERY  2008   COLONOSCOPY     PARTIAL COLECTOMY     colon caner   POLYPECTOMY     Family History  Problem Relation Age of Onset   Arthritis Mother    COPD Mother    Heart disease Mother        Heart attack, no details   Colon cancer Mother 63   Colon polyps Daughter    Colon cancer Maternal Uncle 58       stage IV, d. 105   Prostate cancer Maternal Uncle        dx 20s, d. 23  Colon cancer Cousin        Stage IV, dx. 27s, d. 51   Esophageal cancer Neg Hx    Pancreatic cancer Neg Hx    Rectal cancer Neg Hx    Stomach cancer Neg Hx    Allergies as of 05/13/2022       Reactions   Amlodipine Nausea And Vomiting   Stomach upset   Losartan    "Wet cough"   Valsartan    Shortness of breath and cough   Lisinopril Other (See Comments)   headache        Medication List        Accurate as of May 13, 2022 10:52 AM. If you have any questions, ask your nurse or doctor.          omeprazole 40 MG capsule Commonly known as: PRILOSEC Take 1 capsule (40 mg total) by mouth 2 (two) times daily.   verapamil 120 MG CR tablet Commonly known as: CALAN-SR Take 1 tablet (120 mg total) by mouth at bedtime.        All past medical history, surgical history, allergies, family history, immunizations andmedications were updated in the EMR today and reviewed under the history and medication portions of their EMR.     ROS Negative, with the exception of above mentioned in HPI   Objective:  BP (!) 165/89   Pulse (!) 59   Temp 98.1 F (36.7 C) (Oral)   Ht '5\' 7"'$  (1.702 m)   Wt 166 lb (75.3 kg)   SpO2 99%   BMI 26.00 kg/m  Body mass index is 26 kg/m. Physical Exam ***  No results found. No  results found. No results found for this or any previous visit (from the past 24 hour(s)).  Assessment/Plan: CALAHAN PAK is a 56 y.o. male present for OV for  *** Reviewed expectations re: course of current medical issues. Discussed self-management of symptoms. Outlined signs and symptoms indicating need for more acute intervention. Patient verbalized understanding and all questions were answered. Patient received an After-Visit Summary.    No orders of the defined types were placed in this encounter.  No orders of the defined types were placed in this encounter.  Referral Orders  No referral(s) requested today     Note is dictated utilizing voice recognition software. Although note has been proof read prior to signing, occasional typographical errors still can be missed. If any questions arise, please do not hesitate to call for verification.   electronically signed by:  Howard Pouch, DO  [place]

## 2022-05-15 ENCOUNTER — Ambulatory Visit (HOSPITAL_COMMUNITY)
Admission: RE | Admit: 2022-05-15 | Discharge: 2022-05-15 | Disposition: A | Discharge status: Home / Self Care | Payer: 59 | Source: Ambulatory Visit | Attending: Internal Medicine | Admitting: Internal Medicine

## 2022-05-15 DIAGNOSIS — I10 Essential (primary) hypertension: Secondary | ICD-10-CM | POA: Diagnosis not present

## 2022-05-15 DIAGNOSIS — J301 Allergic rhinitis due to pollen: Secondary | ICD-10-CM | POA: Insufficient documentation

## 2022-05-26 ENCOUNTER — Other Ambulatory Visit: Payer: Self-pay | Admitting: Gastroenterology

## 2022-05-26 DIAGNOSIS — K2100 Gastro-esophageal reflux disease with esophagitis, without bleeding: Secondary | ICD-10-CM

## 2022-06-01 ENCOUNTER — Other Ambulatory Visit (HOSPITAL_COMMUNITY): Payer: Self-pay

## 2022-06-05 ENCOUNTER — Telehealth: Payer: Self-pay | Admitting: Pharmacy Technician

## 2022-06-05 ENCOUNTER — Other Ambulatory Visit (HOSPITAL_COMMUNITY): Payer: Self-pay

## 2022-06-05 NOTE — Telephone Encounter (Signed)
Patient Advocate Encounter  Received notification from [place] that prior authorization for OMEPRAZOLE '40MG'$  is required.   PA submitted on 7.14.23 Key [id number] Status is pending    Luciano Cutter, CPhT Patient Advocate Phone: [phone]

## 2022-06-08 NOTE — Telephone Encounter (Signed)
Patient Advocate Encounter  Prior Authorization for OMEPRAZOLE '40MG'$  has been approved.    Key: [id number] PA Case ID: [id number] Effective dates: 06/05/22 through 06/06/23  Clista Bernhardt, CPhT Pharmacy Patient Advocate Specialist [place] Patient Advocate Team Phone: [phone]   Fax: [phone]

## 2022-06-09 ENCOUNTER — Ambulatory Visit (INDEPENDENT_AMBULATORY_CARE_PROVIDER_SITE_OTHER): Payer: 59 | Admitting: Family Medicine

## 2022-06-09 ENCOUNTER — Encounter: Payer: Self-pay | Admitting: Family Medicine

## 2022-06-09 VITALS — BP 157/90 | HR 62 | Temp 98.3°F | Wt 166.2 lb

## 2022-06-09 DIAGNOSIS — L237 Allergic contact dermatitis due to plants, except food: Secondary | ICD-10-CM | POA: Diagnosis not present

## 2022-06-09 MED ORDER — PREDNISONE 10 MG PO TABS: ORAL_TABLET | ORAL | 0 refills | Status: DC

## 2022-06-09 MED ORDER — METHYLPREDNISOLONE ACETATE 40 MG/ML IJ SUSP
40.0000 mg | Freq: Once | INTRAMUSCULAR | Status: AC
  Administered 2022-06-09: 40 mg via INTRAMUSCULAR

## 2022-06-09 NOTE — Progress Notes (Signed)
OFFICE VISIT  06/09/2022  CC:  Chief Complaint  Patient presents with   Rash    Rash everywhere. Not sure if it is poison ivy. Started Sunday got worst yesterday.   Patient is a 56 y.o. male who presents for rash.  HPI: 3 days ago patient was working in the woods cutting up a tree and did have exposure to poison ivy as well as another vine which he says is known to cause "cow itch", which is a contact dermatitis similar to poison ivy.  The next evening he began to see itchy reddish spots on the ankles and over the last 24 hours he has continued to break out in small itchy patches of papules on both arms, both legs, low back. He has been applying calamine lotion.  These do itch    Past Medical History:  Diagnosis Date   Allergy    mild- uses honey    Chicken pox    Colon cancer ([place])    colon   COVID-19 01/10/2021   11/2020   Family history of colon cancer    Family history of prostate cancer    GERD (gastroesophageal reflux disease)    diet related    Hepatitis B    Hypertension    kidney stone    kidney stone    Lymphadenitis    Pneumothorax     Past Surgical History:  Procedure Laterality Date   COLON SURGERY  2008   COLONOSCOPY     PARTIAL COLECTOMY     colon caner   POLYPECTOMY      Outpatient Medications Prior to Visit  Medication Sig Dispense Refill   irbesartan (AVAPRO) 150 MG tablet Take 1 tablet (150 mg total) by mouth daily. 90 tablet 1   omeprazole (PRILOSEC) 40 MG capsule Take 1 capsule (40 mg total) by mouth 2 (two) times daily. 60 capsule 1   verapamil (CALAN-SR) 120 MG CR tablet Take 1 tablet (120 mg total) by mouth at bedtime. 90 tablet 1   No facility-administered medications prior to visit.    Allergies  Allergen Reactions   Amlodipine Nausea And Vomiting    Stomach upset    Losartan     "Wet cough"   Valsartan     Shortness of breath and cough   Lisinopril Other (See Comments)    headache    ROS As per HPI  PE:    06/09/2022     8:23 AM 05/13/2022   10:35 AM 05/13/2022   10:09 AM  Vitals with BMI  Height   '5\' 7"'$   Weight 166 lbs 3 oz  166 lbs  BMI 37.16  96.78  Systolic 938 101 751  Diastolic 90 89 93  Pulse 62  59     Physical Exam  Gen: Alert, well appearing.  Patient is oriented to person, place, time, and situation. Skin: Scattered small pink papular lesions over the ankles and lower legs as well as the arms and low back and lower abdomen.  A few of these have a slight vesicular portion in the center.  No hives, no petechiae, no pustules.  LABS:  Last metabolic panel Lab Results  Component Value Date   GLUCOSE 90 01/14/2022   NA 140 01/14/2022   K 3.9 01/14/2022   CL 105 01/14/2022   CO2 33 (H) 01/14/2022   BUN 18 01/14/2022   CREATININE 1.10 01/14/2022   GFRNONAA 79 02/10/2017   CALCIUM 9.1 01/14/2022   PROT 6.8 01/14/2022  ALBUMIN 4.1 01/14/2022   BILITOT 0.7 01/14/2022   ALKPHOS 55 01/14/2022   AST 27 01/14/2022   ALT 28 01/14/2022   IMPRESSION AND PLAN:  Contact dermatitis, allergic response to poison ivy versus other vine. Depo Medrol 40 mg IM today. Tomorrow start prednisone 30 mg a day x3 days, then 20 mg a day x3 days, then 10 mg/day x 3 days. Over-the-counter Zyrtec or Claritin or Allegra can be used as needed itching. Okay to continue calamine.  An After Visit Summary was printed and given to the patient.  FOLLOW UP: Return if symptoms worsen or fail to improve.  Signed:  Crissie Sickles, MD           06/09/2022

## 2022-06-09 NOTE — Addendum Note (Signed)
Addended by: Octaviano Glow on: 06/09/2022 09:53 AM   Modules accepted: Orders

## 2022-06-22 ENCOUNTER — Encounter: Payer: Self-pay | Admitting: Gastroenterology

## 2022-06-22 ENCOUNTER — Ambulatory Visit: Payer: 59 | Admitting: Gastroenterology

## 2022-06-22 VITALS — BP 142/92 | HR 70 | Ht 67.0 in | Wt 163.0 lb

## 2022-06-22 DIAGNOSIS — K2270 Barrett's esophagus without dysplasia: Secondary | ICD-10-CM

## 2022-06-22 DIAGNOSIS — K2100 Gastro-esophageal reflux disease with esophagitis, without bleeding: Secondary | ICD-10-CM | POA: Diagnosis not present

## 2022-06-22 NOTE — Patient Instructions (Addendum)
If you are age 57 or older, your body mass index should be between 23-30. Your Body mass index is 25.53 kg/m. If this is out of the aforementioned range listed, please consider follow up with your Primary Care Provider.  If you are age 69 or younger, your body mass index should be between 19-25. Your Body mass index is 25.53 kg/m. If this is out of the aformentioned range listed, please consider follow up with your Primary Care Provider.   ________________________________________________________  The [facility] providers would like to encourage you to use [facility] to communicate with providers for non-urgent requests or questions.  Due to long hold times on the telephone, sending your provider a message by [facility] may be a faster and more efficient way to get a response.  Please allow 48 business hours for a response.  Please remember that this is for non-urgent requests.  _______________________________________________________   Dennis Bast have been scheduled for an endoscopy. Please follow written instructions given to you at your visit today. If you use inhalers (even only as needed), please bring them with you on the day of your procedure.   Due to recent changes in healthcare laws, you may see the results of your imaging and laboratory studies on MyChart before your provider has had a chance to review them.  We understand that in some cases there may be results that are confusing or concerning to you. Not all laboratory results come back in the same time frame and the provider may be waiting for multiple results in order to interpret others.  Please give [place] 48 hours in order for your provider to thoroughly review all the results before contacting the office for clarification of your results.   It was a pleasure to see you today!  Thank you for trusting me with your gastrointestinal care!

## 2022-06-22 NOTE — Progress Notes (Signed)
[name] Progress Note  Chief Complaint: Reflux esophagitis  Subjective  History: [name] saw me in June after a CT scan of the chest revealed thickening of the distal esophagus.  Although he did not report chronic upper digestive symptoms, EGD on 04/29/2022 found a 1 to 2 cm hiatal hernia along with grade D esophagitis and a 3 cm length of apparent Barrett's.  (Pathology noted below)  [name] is feeling well overall.  He still denies daytime heartburn or regurgitation, but now says he would periodically feel as if he had aspiration at night, waking with symptoms of such.  He has felt well since starting the omeprazole after the EGD, and he wanted to discuss the endoscopy and biopsy findings tomorrow.  He is still working on blood pressure control with primary care and cardiology.  ROS: Cardiovascular:  no chest pain Respiratory: no dyspnea  The patient's Past Medical, Family and Social History were reviewed and are on file in the EMR.  Objective:  Med list reviewed  Current Outpatient Medications:    omeprazole (PRILOSEC) 40 MG capsule, TAKE 1 CAPSULE BY MOUTH TWICE A DAY, Disp: 60 capsule, Rfl: 1   verapamil (CALAN-SR) 120 MG CR tablet, Take 1 tablet (120 mg total) by mouth at bedtime., Disp: 90 tablet, Rfl: 1   Vital signs in last 24 hrs: Vitals:   06/22/22 1329  BP: (!) 142/92  Pulse: 70   Wt Readings from Last 3 Encounters:  06/22/22 163 lb (73.9 kg)  06/09/22 166 lb 3.2 oz (75.4 kg)  05/13/22 166 lb (75.3 kg)    Physical Exam  Well-appearing,  Cardiac: Regular without murmur,  no peripheral edema Pulm: clear to auscultation bilaterally, normal RR and effort noted Abdomen: soft, no tenderness, with active bowel sounds. No guarding or palpable hepatosplenomegaly.  Labs:   ___________________________________________ Radiologic studies:   ____________________________________________ Pathology  1. Surgical [P], esophageal biopsies at 38 cm - GASTRIC  CARDIAC-TYPE MUCOSA WITH NO SPECIFIC PATHOLOGIC DIAGNOSIS. - NEGATIVE FOR INTESTINAL METAPLASIA AND MALIGNANCY. 2. Surgical [P], esophageal biopsies at 36 cm - SQUAMOCOLUMNAR MUCOSA, NEGATIVE FOR INTESTINAL METAPLASIA. - FEATURES OF REFLUX EFFECT (REACTIVE SQUAMOUS MUCOSA WITH FOCAL INTRASQUAMOUS EOSINOPHILS (UP TO 6 EOSINOPHILS/HIGH-POWER FIELD)). - NO DYSPLASIA OR MALIGNANCY IDENTIFIED. 3. Surgical [P], esophageal at 35cm- nodular mucosa - SQUAMOCOLUMNAR MUCOSA WITH INTESTINAL METAPLASIA, CONSISTENT WITH BARRETT'S ESOPHAGUS, IN THE PROPER CLINICAL CONTEXT. - NEGATIVE FOR DYSPLASIA. - REACTIVE SQUAMOUS MUCOSA, NEGATIVE FOR INTRASQUAMOUS EOSINOPHILS. 4. Surgical [P], mid esophagus abnormal mucosa - EOSINOPHILIC ESOPHAGITIS PATTERN (UP TO 50 EOSINOPHILS/HIGH-POWER FIELD). - NO GLANDULAR MUCOSA PRESENT.    _____________________________________________ Assessment & Plan  Assessment: Encounter Diagnoses  Name Primary?   Gastroesophageal reflux disease with esophagitis without hemorrhage Yes   Barrett's esophagus without dysplasia     Strange that the more distal biopsies did not show intestinal metaplasia, as this appears to all be a segment of Barrett's with the most proximal area showing nodular reflux related inflammation.  As such, I think reflux explains the mid esophagus biopsies with a prominent eosinophilic infiltrate.  We discussed the need for long-term reflux control including diet and lifestyle measures, elevating head of bed, not eating within several hours of bed and acid suppression for at least the time being. Barrett's esophagus needs long-term surveillance due to his risk of esophagus cancer.  He asked good questions, all of which were answered using diagrams of the anatomy. Plan: Continue current treatment plan, elevate head of bed, dietary changes as noted.  Upper endoscopy  in 6 to 8 weeks to reassess the endoscopy findings and take further biopsies to map the  Barrett's. He was agreeable after discussion of procedure and risks.   Nelida Meuse III

## 2022-06-23 ENCOUNTER — Ambulatory Visit: Payer: 59

## 2022-06-23 DIAGNOSIS — I10 Essential (primary) hypertension: Secondary | ICD-10-CM

## 2022-06-23 DIAGNOSIS — R400 Somnolence: Secondary | ICD-10-CM

## 2022-06-23 DIAGNOSIS — G479 Sleep disorder, unspecified: Secondary | ICD-10-CM

## 2022-06-23 DIAGNOSIS — R0683 Snoring: Secondary | ICD-10-CM

## 2022-06-25 ENCOUNTER — Ambulatory Visit: Payer: 59

## 2022-06-25 DIAGNOSIS — G4733 Obstructive sleep apnea (adult) (pediatric): Secondary | ICD-10-CM | POA: Diagnosis not present

## 2022-07-02 DIAGNOSIS — G4733 Obstructive sleep apnea (adult) (pediatric): Secondary | ICD-10-CM | POA: Diagnosis not present

## 2022-07-13 ENCOUNTER — Encounter (HOSPITAL_BASED_OUTPATIENT_CLINIC_OR_DEPARTMENT_OTHER): Payer: 59 | Admitting: Cardiovascular Disease

## 2022-07-14 ENCOUNTER — Ambulatory Visit (INDEPENDENT_AMBULATORY_CARE_PROVIDER_SITE_OTHER): Payer: 59 | Admitting: Emergency Medicine

## 2022-07-14 ENCOUNTER — Encounter: Payer: Self-pay | Admitting: Emergency Medicine

## 2022-07-14 DIAGNOSIS — G4733 Obstructive sleep apnea (adult) (pediatric): Secondary | ICD-10-CM | POA: Diagnosis not present

## 2022-07-14 DIAGNOSIS — R0600 Dyspnea, unspecified: Secondary | ICD-10-CM | POA: Diagnosis not present

## 2022-07-14 DIAGNOSIS — R911 Solitary pulmonary nodule: Secondary | ICD-10-CM

## 2022-07-14 LAB — PULMONARY FUNCTION TEST
DL/VA % pred: 97 %
DL/VA: 4.25 ml/min/mmHg/L
DLCO cor % pred: 106 %
DLCO cor: 27.47 ml/min/mmHg
DLCO unc % pred: 106 %
DLCO unc: 27.47 ml/min/mmHg
FEF 25-75 Post: 2.31 L/sec
FEF 25-75 Pre: 2.37 L/sec
FEF2575-%Change-Post: -2 %
FEF2575-%Pred-Post: 79 %
FEF2575-%Pred-Pre: 81 %
FEV1-%Change-Post: -1 %
FEV1-%Pred-Post: 96 %
FEV1-%Pred-Pre: 98 %
FEV1-Post: 3.26 L
FEV1-Pre: 3.31 L
FEV1FVC-%Change-Post: -4 %
FEV1FVC-%Pred-Pre: 89 %
FEV6-%Change-Post: 3 %
FEV6-%Pred-Post: 119 %
FEV6-%Pred-Pre: 115 %
FEV6-Post: 5.02 L
FEV6-Pre: 4.86 L
FEV6FVC-%Pred-Post: 104 %
FEV6FVC-%Pred-Pre: 104 %
FVC-%Change-Post: 3 %
FVC-%Pred-Post: 114 %
FVC-%Pred-Pre: 110 %
FVC-Post: 5.02 L
FVC-Pre: 4.86 L
Post FEV1/FVC ratio: 65 %
Post FEV6/FVC ratio: 100 %
Pre FEV1/FVC ratio: 68 %
Pre FEV6/FVC Ratio: 100 %
RV % pred: 130 %
RV: 2.6 L
TLC % pred: 122 %
TLC: 7.78 L

## 2022-07-14 MED ORDER — ALBUTEROL SULFATE HFA 108 (90 BASE) MCG/ACT IN AERS: 2.0000 | INHALATION_SPRAY | Freq: Four times a day (QID) | RESPIRATORY_TRACT | 6 refills | Status: AC | PRN

## 2022-07-14 NOTE — Progress Notes (Signed)
Full PFT performed today. °

## 2022-07-14 NOTE — Addendum Note (Signed)
Addended by: Gavin Potters R on: 07/14/2022 11:15 AM   Modules accepted: Orders

## 2022-07-14 NOTE — Patient Instructions (Signed)
Full PFT performed today. °

## 2022-07-14 NOTE — Assessment & Plan Note (Signed)
Documented on home sleep study 16.7/h.  He is willing to try CPAP and we will initiate auto titration CPAP 5-20 cmH2O with best fit mask.  Follow-up in November to establish compliance and clinical benefit.

## 2022-07-14 NOTE — Assessment & Plan Note (Signed)
Question whether this may relate to his verapamil.  It certainly relates to the medication in time.  He has mild obstruction on his pulmonary function testing without any evidence of restriction.  Consistent with subclinical asthma and a minimal smoker.  We will hold off on scheduled BD but I will give him an albuterol to keep to use as needed.

## 2022-07-14 NOTE — Assessment & Plan Note (Signed)
Found incidentally 03/23/2022.  We are repeating his CT scan of the chest in November for interval stability.

## 2022-07-14 NOTE — Patient Instructions (Signed)
We will plan to repeat your CT scan of the chest in November We will start CPAP, best fit mask, to initiate treatment of sleep apnea.  We will follow-up in November to assess compliance and clinical benefit. We will give you an albuterol inhaler.  You can use 2 puffs up to every 4 hours if needed for shortness of breath, chest tightness, wheezing. Agree with talking to your primary physician about the pros and cons of continuing verapamil since it may be related to shortness of breath. Follow with Dr Lamonte Sakai in November so we can review your CPAP use and your CT scan of the chest.

## 2022-07-14 NOTE — Progress Notes (Signed)
Subjective:    Patient ID: [name], male    DOB: 17-Oct-1966, 56 y.o.   MRN: [id number]  HPI 56 year old former smoker (5 pack years) with poorly controlled hypertension, colon cancer that was treated.  PMH also hepatitis B, lymphadenitis.  He is referred today for evaluation of an abnormal CT scan of the chest. He reports that he was seen in the emergency department in [place] on 5/1 for left-sided chest discomfort associated with left hand numbness, dyspnea.  He was found to be profoundly hypertensive 235/120.  Chest x-ray showed questionable outlining of the cardiomediastinal structures raising possibility of pneumomediastinum.  For this reason the CT chest was done He reports intermittent SOB, cough in the am with mucous production.   CT chest done at [facility] on 03/23/2022.  The report is available but no images at this time.  Scan identified a 1 cm ill-defined left upper lobe pulmonary nodule with mild spiculation.  There was some thickening of the distal esophageal wall.  No hilar or mediastinal adenopathy.  He had cardiac scoring CT chest 07/31/21 that I reviewed > the nodule is not imaged   ROV 07/14/22 --follow-up visit for [name] who is 78 with a minimal tobacco history.  He has hypertension, history of colon cancer, hep B, lymphadenitis.  I saw him for an abnormal CT chest in 03/23/2022 that identified a 1 cm spiculated left upper lobe pulmonary nodule and some thickening of the esophageal wall.  We planned to repeat his CT chest in 6 months, planning for November.  He also had pulmonary function testing to evaluate shortness of breath.  Finally we performed a home sleep study. He relates the exertional SOB to initiation of the verapamil.   Home sleep study 06/23/2022 showed an AHI of 16.7 with associated desaturation consistent with sleep apnea.  CPAP titration was recommended.  Pulmonary function testing performed today and reviewed by me shows mild obstruction without a  bronchodilator response.  FEV1 98% predicted.  Lung volumes show hyperinflation.  Diffusion capacity is normal.     Review of Systems As per HPI  Past Medical History:  Diagnosis Date   Allergy    mild- uses honey    Chicken pox    Colon cancer ([place])    colon   COVID-19 01/10/2021   11/2020   Family history of colon cancer    Family history of prostate cancer    GERD (gastroesophageal reflux disease)    diet related    Hepatitis B    Hypertension    kidney stone    kidney stone    Lymphadenitis    Pneumothorax      Family History  Problem Relation Age of Onset   Arthritis Mother    COPD Mother    Heart disease Mother        Heart attack, no details   Colon cancer Mother 45   Colon polyps Daughter    Colon cancer Maternal Uncle 67       stage IV, d. 17   Prostate cancer Maternal Uncle        dx 80s, d. 21   Colon cancer Cousin        Stage IV, dx. 51s, d. 39   Esophageal cancer Neg Hx    Pancreatic cancer Neg Hx    Rectal cancer Neg Hx    Stomach cancer Neg Hx     No family hx lung CA  Social History   Socioeconomic History  Marital status: Single    Spouse name: Not on file   Number of children: Not on file   Years of education: Not on file   Highest education level: Not on file  Occupational History   Not on file  Tobacco Use   Smoking status: Former    Types: Cigarettes   Smokeless tobacco: Never  Vaping Use   Vaping Use: Never used  Substance and Sexual Activity   Alcohol use: No    Alcohol/week: 0.0 standard drinks of alcohol   Drug use: No   Sexual activity: Yes    Birth control/protection: None    Comment: Male partner  Other Topics Concern   Not on file  Social History Narrative   Single, 1 child.   Self-employed, college education.   Drinks caffeinated beverages, uses herbal remedies.   Wears his seatbelt, exercises greater than 3 times a week.   Smoke detector in the home, firearms in the home in a locked cabinet.   Feels  safe in his relationships.   Social Determinants of Health   Financial Resource Strain: Medium Risk (03/23/2022)   Overall Financial Resource Strain (CARDIA)    Difficulty of Paying Living Expenses: Somewhat hard  Food Insecurity: Food Insecurity Present (03/23/2022)   Hunger Vital Sign    Worried About Running Out of Food in the Last Year: Sometimes true    Ran Out of Food in the Last Year: Never true  Transportation Needs: No Transportation Needs (03/23/2022)   PRAPARE - Hydrologist (Medical): No    Lack of Transportation (Non-Medical): No  Physical Activity: Sufficiently Active (03/23/2022)   Exercise Vital Sign    Days of Exercise per Week: 7 days    Minutes of Exercise per Session: 60 min  Stress: No Stress Concern Present (03/23/2022)   [place]    Feeling of Stress : Not at all  Social Connections: Moderately Integrated (03/23/2022)   Social Connection and Isolation Panel [NHANES]    Frequency of Communication with Friends and Family: More than three times a week    Frequency of Social Gatherings with Friends and Family: Once a week    Attends Religious Services: More than 4 times per year    Active Member of [employer] or Organizations: No    Attends Music therapist: Not on file    Marital Status: Living with partner  Intimate Partner Violence: Not on file     Allergies  Allergen Reactions   Amlodipine Nausea And Vomiting    Stomach upset    Losartan     "Wet cough"   Valsartan     Shortness of breath and cough   Lisinopril Other (See Comments)    headache     Outpatient Medications Prior to Visit  Medication Sig Dispense Refill   omeprazole (PRILOSEC) 40 MG capsule TAKE 1 CAPSULE BY MOUTH TWICE A DAY 60 capsule 1   verapamil (CALAN-SR) 120 MG CR tablet Take 1 tablet (120 mg total) by mouth at bedtime. 90 tablet 1   No facility-administered medications prior to  visit.         Objective:   Physical Exam  Vitals:   07/14/22 0959  BP: 138/74  Pulse: (!) 54  Temp: 98.1 F (36.7 C)  TempSrc: Oral  SpO2: 100%  Weight: 160 lb (72.6 kg)  Height: '5\' 7"'$  (1.702 m)   Gen: [place], well-nourished, in no distress,  normal  affect  ENT: No lesions,  mouth clear,  oropharynx clear, no postnasal drip  Neck: No JVD, no stridor  Lungs: No use of accessory muscles, no crackles or wheezing on normal respiration, no wheeze on forced expiration  Cardiovascular: RRR, heart sounds normal, no murmur or gallops, no peripheral edema  Musculoskeletal: No deformities, no cyanosis or clubbing  Neuro: alert, awake, non focal  Skin: Warm, no lesions or rash     Assessment & Plan:  OSA (obstructive sleep apnea) Documented on home sleep study 16.7/h.  He is willing to try CPAP and we will initiate auto titration CPAP 5-20 cmH2O with best fit mask.  Follow-up in November to establish compliance and clinical benefit.  Pulmonary nodule Found incidentally 03/23/2022.  We are repeating his CT scan of the chest in November for interval stability.  Dyspnea Question whether this may relate to his verapamil.  It certainly relates to the medication in time.  He has mild obstruction on his pulmonary function testing without any evidence of restriction.  Consistent with subclinical asthma and a minimal smoker.  We will hold off on scheduled BD but I will give him an albuterol to keep to use as needed.   Baltazar Apo, MD, PhD 07/14/2022, 10:41 AM [facility] [phone] or if no answer before 7:00PM call [phone] For any issues after 7:00PM please call eLink [phone]

## 2022-07-15 DIAGNOSIS — I10 Essential (primary) hypertension: Secondary | ICD-10-CM | POA: Diagnosis not present

## 2022-07-22 ENCOUNTER — Telehealth: Payer: Self-pay | Admitting: Pulmonary Disease

## 2022-07-22 NOTE — Telephone Encounter (Signed)
Patient is seen in office by Dr. Lamonte Sakai.  DR. Lamonte Sakai ordered cpap machine on 07/14/22.   Called and spoke with patient.  Patient was concerned with cpap wait.  Explained order was sent to [facility] and confirmed it was received.  Patient order is currently in process and he should hear from [place] soon.  Understanding stated. Nothing further at this time.

## 2022-07-22 NOTE — Telephone Encounter (Signed)
Patient is calling to follow up on an order for a CPAP machine which he thought the doctor had put in the order at his last visit on 8/22.  Patient would like to discuss with the nurse or doctor to see when he can get the machine.  Please advise and call patient to let him know  CB# [phone]

## 2022-07-29 DIAGNOSIS — G4733 Obstructive sleep apnea (adult) (pediatric): Secondary | ICD-10-CM | POA: Diagnosis not present

## 2022-08-10 ENCOUNTER — Ambulatory Visit: Payer: 59 | Admitting: Family Medicine

## 2022-08-12 DIAGNOSIS — I10 Essential (primary) hypertension: Secondary | ICD-10-CM | POA: Diagnosis not present

## 2022-08-28 DIAGNOSIS — G4733 Obstructive sleep apnea (adult) (pediatric): Secondary | ICD-10-CM | POA: Diagnosis not present

## 2022-09-04 ENCOUNTER — Encounter: Payer: 59 | Admitting: Gastroenterology

## 2022-09-12 NOTE — Progress Notes (Deleted)
Cardiology Office Note   Date:  09/12/2022   ID:  [name], DOB [birth year], MRN [id number]  PCP:  Ma Hillock, DO  Cardiologist:   Minus Breeding, MD Referring:  Ma Hillock, DO   No chief complaint on file.     History of Present Illness: [name] is a 56 y.o. male who presents for evaluation of hypertension.   He has been intolerant to medications.  Is been on multiple drugs including amlodipine, losartan and lisinopril for various reasons he did not tolerate.  I tried clonidine and he did not want to take this.  He has been on 120 of verapamil but he says is not controlling his blood pressure.  ***   ***  He wants to try some nutraceutical therapies.  He is concerned about a secondary cause.  He does snore, he has daytime somnolence, there is apparently been witnessed apnea (STOP-BANG 5).  He has been taking his verapamil per his report.  He is not really having any side effects to this.  He denies any chest pressure, neck or arm discomfort.  There is no new palpitations, presyncope or syncope.  He has no new shortness of breath, PND or orthopnea.  He said no weight gain or edema.   Past Medical History:  Diagnosis Date   Allergy    mild- uses honey    Chicken pox    Colon cancer ([place])    colon   COVID-19 01/10/2021   11/2020   Family history of colon cancer    Family history of prostate cancer    GERD (gastroesophageal reflux disease)    diet related    Hepatitis B    Hypertension    kidney stone    kidney stone    Lymphadenitis    Pneumothorax     Past Surgical History:  Procedure Laterality Date   COLON SURGERY  2008   COLONOSCOPY     PARTIAL COLECTOMY     colon caner   POLYPECTOMY       Current Outpatient Medications  Medication Sig Dispense Refill   albuterol (VENTOLIN HFA) 108 (90 Base) MCG/ACT inhaler Inhale 2 puffs into the lungs every 6 (six) hours as needed for wheezing or shortness of breath. 8 g 6   omeprazole  (PRILOSEC) 40 MG capsule TAKE 1 CAPSULE BY MOUTH TWICE A DAY 60 capsule 1   verapamil (CALAN-SR) 120 MG CR tablet Take 1 tablet (120 mg total) by mouth at bedtime. 90 tablet 1   No current facility-administered medications for this visit.    Allergies:   Amlodipine, Losartan, Valsartan, and Lisinopril     ROS:  Please see the history of present illness.   Otherwise, review of systems are positive for ***.   All other systems are reviewed and negative.    PHYSICAL EXAM: VS:  There were no vitals taken for this visit. , BMI There is no height or weight on file to calculate BMI. GENERAL:  Well appearing NECK:  No jugular venous distention, waveform within normal limits, carotid upstroke brisk and symmetric, no bruits, no thyromegaly LUNGS:  Clear to auscultation bilaterally CHEST:  Unremarkable HEART:  PMI not displaced or sustained,S1 and S2 within normal limits, no S3, no S4, no clicks, no rubs, *** murmurs ABD:  Flat, positive bowel sounds normal in frequency in pitch, no bruits, no rebound, no guarding, no midline pulsatile mass, no hepatomegaly, no splenomegaly EXT:  2 plus pulses  throughout, no edema, no cyanosis no clubbing     ***GENERAL:  Well appearing NECK:  No jugular venous distention, waveform within normal limits, carotid upstroke brisk and symmetric, no bruits, no thyromegaly LUNGS:  Clear to auscultation bilaterally CHEST:  Unremarkable HEART:  PMI not displaced or sustained,S1 and S2 within normal limits, no S3, no S4, no clicks, no rubs, no murmurs ABD:  Flat, positive bowel sounds normal in frequency in pitch, no bruits, no rebound, no guarding, no midline pulsatile mass, no hepatomegaly, no splenomegaly EXT:  2 plus pulses throughout, no edema, no cyanosis no clubbing   EKG:  EKG is *** ordered today. The ekg ordered today demonstrates sinus bradycardia, rate ***, axis within normal limits, intervals within normal limits, no acute ST-T wave changes.   Recent  Labs: 01/14/2022: ALT 28; BUN 18; Creatinine, Ser 1.10; Hemoglobin 14.3; Platelets 201.0; Potassium 3.9; Sodium 140; TSH 1.10    Lipid Panel    Component Value Date/Time   CHOL 160 01/14/2022 0839   TRIG 60.0 01/14/2022 0839   TRIG 77 12/03/2006 1156   HDL 45.30 01/14/2022 0839   CHOLHDL 4 01/14/2022 0839   VLDL 12.0 01/14/2022 0839   LDLCALC 103 (H) 01/14/2022 0839   LDLDIRECT 101.0 02/10/2017 0912      Wt Readings from Last 3 Encounters:  07/14/22 160 lb (72.6 kg)  06/22/22 163 lb (73.9 kg)  06/09/22 166 lb 3.2 oz (75.4 kg)      Other studies Reviewed: Additional studies/ records that were reviewed today include: *** Review of the above records demonstrates:  ***   ASSESSMENT AND PLAN:  Difficult to control HTN:     ***  The patient is very reluctant to take any more medications.  I will increase verapamil but he does not want to do this yet.  He wants to try his nutraceuticals but he thinks he will continue the current dose of verapamil.  He does have potential sleep apnea and I will order a home sleep test.  I will also look for secondary causes with renal artery Dopplers and also urinalysis.  We talked again about therapeutic lifestyle changes but he is not overweight, he is very physically active.  He could reduce his salt.  I doubt that there is a secondary cause but will look forward as above.  We had a long discussion about essential hypertension and how it requires oftentimes a few medications we have to look for combinations that he tolerates.  He is very frustrated about this.   Current medicines are reviewed at length with the patient today.  The patient does not have concerns regarding medicines.  The following changes have been made:   ***  Labs/ tests ordered today include:   ***  No orders of the defined types were placed in this encounter.    Disposition:   FU with me in *** months.     Signed, Minus Breeding, MD  09/12/2022 10:51 AM    [facility]

## 2022-09-14 ENCOUNTER — Ambulatory Visit: Payer: 59 | Admitting: Cardiology

## 2022-09-14 DIAGNOSIS — I10 Essential (primary) hypertension: Secondary | ICD-10-CM

## 2022-09-17 NOTE — Progress Notes (Signed)
Cardiology Office Note   Date:  09/18/2022   ID:  [name], DOB June 14, 1966, MRN [id number]  PCP:  Ma Hillock, DO  Cardiologist:   Minus Breeding, MD Referring:  Ma Hillock, DO   No chief complaint on file.     History of Present Illness: [name] is a 56 y.o. male who presents for evaluation of hypertension.   He has been intolerant to medications.  Is been on multiple drugs including amlodipine, losartan and lisinopril for various reasons he did not tolerate.  I tried clonidine and he did not want to take this.  He has been on 120 of verapamil but he says is not controlling his blood pressure.   He is now diagnosed with sleep apnea and is using a trial of CPAP.    He says that he feels better on the CPAP.  He is sleeping better.  He is due to have follow-up in November.  He thinks his blood pressure is better controlled and he is now taking Norvasc which he did not tolerate in the past but he is now.  He is staying active.  He denies any cardiovascular symptoms.  Past Medical History:  Diagnosis Date   Allergy    mild- uses honey    Chicken pox    Colon cancer ([place])    colon   COVID-19 01/10/2021   11/2020   Family history of colon cancer    Family history of prostate cancer    GERD (gastroesophageal reflux disease)    diet related    Hepatitis B    Hypertension    kidney stone    kidney stone    Lymphadenitis    Pneumothorax     Past Surgical History:  Procedure Laterality Date   COLON SURGERY  2008   COLONOSCOPY     PARTIAL COLECTOMY     colon caner   POLYPECTOMY       Current Outpatient Medications  Medication Sig Dispense Refill   albuterol (VENTOLIN HFA) 108 (90 Base) MCG/ACT inhaler Inhale 2 puffs into the lungs every 6 (six) hours as needed for wheezing or shortness of breath. 8 g 6   amLODipine (NORVASC) 10 MG tablet Take 10 mg by mouth daily.     No current facility-administered medications for this visit.    Allergies:    Amlodipine, Losartan, Valsartan, and Lisinopril     ROS:  Please see the history of present illness.   Otherwise, review of systems are positive for none.   All other systems are reviewed and negative.    PHYSICAL EXAM: VS:  BP (!) 138/90   Pulse (!) 53   Ht '5\' 7"'$  (1.702 m)   Wt 168 lb 3.2 oz (76.3 kg)   SpO2 98%   BMI 26.34 kg/m  , BMI Body mass index is 26.34 kg/m. GENERAL:  Well appearing NECK:  No jugular venous distention, waveform within normal limits, carotid upstroke brisk and symmetric, no bruits, no thyromegaly LUNGS:  Clear to auscultation bilaterally CHEST:  Unremarkable HEART:  PMI not displaced or sustained,S1 and S2 within normal limits, no S3, no S4, no clicks, no rubs, no murmurs ABD:  Flat, positive bowel sounds normal in frequency in pitch, no bruits, no rebound, no guarding, no midline pulsatile mass, no hepatomegaly, no splenomegaly EXT:  2 plus pulses throughout, no edema, no cyanosis no clubbing   EKG:  EKG is  ordered today. The ekg ordered today  demonstrates sinus bradycardia, rate 53, axis within normal limits, intervals within normal limits, no acute ST-T wave changes.   Recent Labs: 01/14/2022: ALT 28; BUN 18; Creatinine, Ser 1.10; Hemoglobin 14.3; Platelets 201.0; Potassium 3.9; Sodium 140; TSH 1.10    Lipid Panel    Component Value Date/Time   CHOL 160 01/14/2022 0839   TRIG 60.0 01/14/2022 0839   TRIG 77 12/03/2006 1156   HDL 45.30 01/14/2022 0839   CHOLHDL 4 01/14/2022 0839   VLDL 12.0 01/14/2022 0839   LDLCALC 103 (H) 01/14/2022 0839   LDLDIRECT 101.0 02/10/2017 0912      Wt Readings from Last 3 Encounters:  09/18/22 168 lb 3.2 oz (76.3 kg)  07/14/22 160 lb (72.6 kg)  06/22/22 163 lb (73.9 kg)      Other studies Reviewed: Additional studies/ records that were reviewed today include: None Review of the above records demonstrates:  NA   ASSESSMENT AND PLAN:  Difficult to control HTN:     Blood pressure is controlled currently  on the dose of Norvasc and he is tolerating this.  He may have some benefit because of management of his newly diagnosed sleep apnea.     Current medicines are reviewed at length with the patient today.  The patient does not have concerns regarding medicines.  The following changes have been made:   None  Labs/ tests ordered today include:   None  Orders Placed This Encounter  Procedures   EKG 12-Lead     Disposition:   FU with me in 12 months.     Signed, Minus Breeding, MD  09/18/2022 8:53 AM    [facility]

## 2022-09-18 ENCOUNTER — Ambulatory Visit: Payer: 59 | Attending: Cardiology | Admitting: Cardiology

## 2022-09-18 ENCOUNTER — Encounter: Payer: Self-pay | Admitting: Cardiology

## 2022-09-18 VITALS — BP 138/90 | HR 53 | Ht 67.0 in | Wt 168.2 lb

## 2022-09-18 DIAGNOSIS — I10 Essential (primary) hypertension: Secondary | ICD-10-CM

## 2022-09-18 NOTE — Patient Instructions (Signed)
Medication Instructions:  No changes    Lab Work: Not needed   Testing/Procedures:  Not needd  Follow-Up: At [facility], you and your health needs are our priority.  As part of our continuing mission to provide you with exceptional heart care, we have created designated Provider Care Teams.  These Care Teams include your primary Cardiologist (physician) and Advanced Practice Providers (APPs -  Physician Assistants and Nurse Practitioners) who all work together to provide you with the care you need, when you need it.     Your next appointment:     as needed  The format for your next appointment:   In Person  Provider:   Minus Breeding, MD

## 2022-09-28 DIAGNOSIS — G4733 Obstructive sleep apnea (adult) (pediatric): Secondary | ICD-10-CM | POA: Diagnosis not present

## 2022-10-01 ENCOUNTER — Ambulatory Visit (HOSPITAL_BASED_OUTPATIENT_CLINIC_OR_DEPARTMENT_OTHER): Payer: 59

## 2022-10-02 ENCOUNTER — Ambulatory Visit
Admission: RE | Admit: 2022-10-02 | Discharge: 2022-10-02 | Disposition: A | Discharge status: Home / Self Care | Payer: 59 | Source: Ambulatory Visit | Attending: Emergency Medicine | Admitting: Emergency Medicine

## 2022-10-02 DIAGNOSIS — R911 Solitary pulmonary nodule: Secondary | ICD-10-CM | POA: Diagnosis not present

## 2022-10-02 DIAGNOSIS — J449 Chronic obstructive pulmonary disease, unspecified: Secondary | ICD-10-CM | POA: Diagnosis not present

## 2022-10-02 DIAGNOSIS — R918 Other nonspecific abnormal finding of lung field: Secondary | ICD-10-CM | POA: Diagnosis not present

## 2022-10-12 DIAGNOSIS — G4733 Obstructive sleep apnea (adult) (pediatric): Secondary | ICD-10-CM | POA: Diagnosis not present

## 2022-10-12 DIAGNOSIS — Z85038 Personal history of other malignant neoplasm of large intestine: Secondary | ICD-10-CM | POA: Diagnosis not present

## 2022-10-12 DIAGNOSIS — R911 Solitary pulmonary nodule: Secondary | ICD-10-CM | POA: Diagnosis not present

## 2022-10-12 DIAGNOSIS — J449 Chronic obstructive pulmonary disease, unspecified: Secondary | ICD-10-CM | POA: Diagnosis not present

## 2022-10-12 DIAGNOSIS — I10 Essential (primary) hypertension: Secondary | ICD-10-CM | POA: Diagnosis not present

## 2022-10-14 ENCOUNTER — Other Ambulatory Visit: Payer: Self-pay

## 2022-10-14 DIAGNOSIS — R911 Solitary pulmonary nodule: Secondary | ICD-10-CM

## 2022-10-14 NOTE — Progress Notes (Signed)
Spoke with patient.  Reviewed note per Dr. Lamonte Sakai regarding CT scan results.  Patient will follow up with repeat CT scan in 6 months per Dr. Lamonte Sakai (May 2024).  Patient agrees with getting repeat CT scan.  Patient verbalized understanding and had no further questions.  Order was placed for request of CT in May 2024.

## 2022-10-28 DIAGNOSIS — G4733 Obstructive sleep apnea (adult) (pediatric): Secondary | ICD-10-CM | POA: Diagnosis not present

## 2022-11-04 DIAGNOSIS — G4733 Obstructive sleep apnea (adult) (pediatric): Secondary | ICD-10-CM | POA: Diagnosis not present

## 2023-03-24 ENCOUNTER — Ambulatory Visit
Admission: RE | Admit: 2023-03-24 | Discharge: 2023-03-24 | Disposition: A | Discharge status: Home / Self Care | Payer: Commercial Managed Care - HMO | Source: Ambulatory Visit | Attending: Emergency Medicine | Admitting: Emergency Medicine

## 2023-03-24 DIAGNOSIS — R911 Solitary pulmonary nodule: Secondary | ICD-10-CM

## 2024-01-27 DIAGNOSIS — I1A0 Resistant hypertension: Secondary | ICD-10-CM | POA: Diagnosis not present
